# Patient Record
Sex: Female | Born: 1955 | Race: White | Hispanic: No | Smoking: Former smoker
Health system: Southern US, Community
[De-identification: ages and names within clinical notes are randomized; demographics above are authoritative.]

## PROBLEM LIST (undated history)

## (undated) DIAGNOSIS — E559 Vitamin D deficiency, unspecified: Secondary | ICD-10-CM

## (undated) DIAGNOSIS — K219 Gastro-esophageal reflux disease without esophagitis: Secondary | ICD-10-CM

## (undated) DIAGNOSIS — F419 Anxiety disorder, unspecified: Secondary | ICD-10-CM

## (undated) HISTORY — DX: Anxiety disorder, unspecified: F41.9

## (undated) HISTORY — PX: CHOLECYSTECTOMY: SHX55

## (undated) HISTORY — DX: Vitamin D deficiency, unspecified: E55.9

## (undated) HISTORY — DX: Gastro-esophageal reflux disease without esophagitis: K21.9

## (undated) HISTORY — PX: ANAL SPHINCTEROPLASTY: SUR1305

---
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
## 1981-05-13 HISTORY — PX: ABDOMINAL HYSTERECTOMY: SHX81

## 2006-09-17 ENCOUNTER — Ambulatory Visit: Payer: Self-pay

## 2007-07-07 ENCOUNTER — Ambulatory Visit: Payer: Self-pay | Admitting: Gastroenterology

## 2008-04-06 ENCOUNTER — Ambulatory Visit: Payer: Self-pay | Admitting: Family Medicine

## 2009-05-19 ENCOUNTER — Ambulatory Visit: Payer: Self-pay | Admitting: Family Medicine

## 2009-09-07 ENCOUNTER — Ambulatory Visit: Payer: Self-pay

## 2009-09-11 ENCOUNTER — Ambulatory Visit: Payer: Self-pay | Admitting: Podiatry

## 2009-09-15 ENCOUNTER — Ambulatory Visit: Payer: Self-pay | Admitting: Podiatry

## 2010-07-20 ENCOUNTER — Ambulatory Visit: Payer: Self-pay | Admitting: Family Medicine

## 2011-06-20 ENCOUNTER — Emergency Department: Payer: Self-pay | Admitting: Emergency Medicine

## 2011-06-20 LAB — CBC
HCT: 42.6 % (ref 35.0–47.0)
HGB: 14.6 g/dL (ref 12.0–16.0)
Platelet: 222 10*3/uL (ref 150–440)
RBC: 4.79 10*6/uL (ref 3.80–5.20)
WBC: 6.7 10*3/uL (ref 3.6–11.0)

## 2011-06-20 LAB — BASIC METABOLIC PANEL
Anion Gap: 10 (ref 7–16)
BUN: 11 mg/dL (ref 7–18)
Calcium, Total: 8.9 mg/dL (ref 8.5–10.1)
Creatinine: 0.73 mg/dL (ref 0.60–1.30)
EGFR (African American): >60
EGFR (Non-African Amer.): >60
Glucose: 117 mg/dL — ABNORMAL HIGH (ref 65–99)
Osmolality: 284 (ref 275–301)
Sodium: 142 mmol/L (ref 136–145)

## 2011-09-11 ENCOUNTER — Ambulatory Visit: Payer: Self-pay

## 2012-11-06 DIAGNOSIS — K589 Irritable bowel syndrome without diarrhea: Secondary | ICD-10-CM | POA: Insufficient documentation

## 2012-11-06 DIAGNOSIS — R159 Full incontinence of feces: Secondary | ICD-10-CM | POA: Insufficient documentation

## 2012-11-06 DIAGNOSIS — F17210 Nicotine dependence, cigarettes, uncomplicated: Secondary | ICD-10-CM | POA: Insufficient documentation

## 2012-11-06 DIAGNOSIS — IMO0002 Reserved for concepts with insufficient information to code with codable children: Secondary | ICD-10-CM | POA: Insufficient documentation

## 2012-11-12 DIAGNOSIS — N3281 Overactive bladder: Secondary | ICD-10-CM | POA: Insufficient documentation

## 2012-12-15 ENCOUNTER — Emergency Department: Payer: Self-pay | Admitting: Emergency Medicine

## 2014-04-10 ENCOUNTER — Emergency Department: Payer: Self-pay | Admitting: Internal Medicine

## 2015-01-09 ENCOUNTER — Encounter: Payer: Self-pay | Admitting: Family Medicine

## 2015-01-09 ENCOUNTER — Ambulatory Visit (INDEPENDENT_AMBULATORY_CARE_PROVIDER_SITE_OTHER): Payer: Self-pay | Admitting: Family Medicine

## 2015-01-09 ENCOUNTER — Other Ambulatory Visit: Payer: Self-pay | Admitting: Family Medicine

## 2015-01-09 ENCOUNTER — Telehealth: Payer: Self-pay | Admitting: Family Medicine

## 2015-01-09 VITALS — BP 126/80 | HR 74 | Temp 98.2°F | Resp 16 | Ht 67.5 in | Wt 169.8 lb

## 2015-01-09 DIAGNOSIS — F329 Major depressive disorder, single episode, unspecified: Secondary | ICD-10-CM

## 2015-01-09 DIAGNOSIS — F32A Depression, unspecified: Secondary | ICD-10-CM

## 2015-01-09 DIAGNOSIS — E559 Vitamin D deficiency, unspecified: Secondary | ICD-10-CM | POA: Insufficient documentation

## 2015-01-09 DIAGNOSIS — F419 Anxiety disorder, unspecified: Secondary | ICD-10-CM

## 2015-01-09 DIAGNOSIS — K219 Gastro-esophageal reflux disease without esophagitis: Secondary | ICD-10-CM | POA: Insufficient documentation

## 2015-01-09 MED ORDER — ALPRAZOLAM 1 MG PO TABS: 1.0000 mg | ORAL_TABLET | Freq: Three times a day (TID) | ORAL | Status: DC | PRN

## 2015-01-09 MED ORDER — CITALOPRAM HYDROBROMIDE 20 MG PO TABS: 20.0000 mg | ORAL_TABLET | Freq: Every day | ORAL | Status: DC

## 2015-01-09 MED ORDER — ESCITALOPRAM OXALATE 10 MG PO TABS: ORAL_TABLET | ORAL | Status: DC

## 2015-01-09 NOTE — Telephone Encounter (Signed)
Change of prescription due to cost, please advise.

## 2015-01-09 NOTE — Progress Notes (Signed)
Subjective:     Patient ID: Evelyn Wiley, female   DOB: [birth year], 59 y.o.   MRN: [id number]  HPI  Chief Complaint  Patient presents with  . Depression    Patient is present in office today to discuss feeling of depression, patient states that she has been dealing with this for many years and was improving but in the past 2 months symptoms had returned. Patient states that it had began when she started having problems with her boyfriend, patient reports trying to get out of relationship and move closer to her daughter. Patient reports that symptoms of depression have been affecting her home life and work, patient states that she hates her job.  Denies suicidal ideation. Has been on sertraline in the past and did not find it helpful. She has used her last Xanax recently to help sleep. 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 Wishes time off for medication to start working and to prepare to move to the [place] area.   Review of Systems  Constitutional:       Has lost a significant amount of weight with dietary changes and appetite suppressants for a few months [facility].  Psychiatric/Behavioral:       Describes decrease concentration and inability to get to sleep: "My brain ain't shutting off."       Objective:   Physical Exam  Constitutional: She appears well-developed and well-nourished. No distress.  Neck: No thyromegaly present.  Cardiovascular: Normal rate and regular rhythm.   Pulmonary/Chest: Breath sounds normal.  Psychiatric: Her behavior is normal. Thought content normal. Her mood appears anxious.       Assessment:    1. Anxiety - ALPRAZolam (XANAX) 1 MG tablet; Take 1 tablet (1 mg total) by mouth 3 (three) times daily as needed for anxiety.  Dispense: 30 tablet; Refill: 0  2. Depression - escitalopram (LEXAPRO) 10 MG tablet; Start at 1/2 pill for the first 5 days then a whole pill daily  Dispense: 30 tablet;  Refill: 0    Plan:   Phone f/u in two weeks. Work excuse for 8/29-9/6.

## 2015-01-09 NOTE — Patient Instructions (Signed)
Phone f/u in 2 weeks 

## 2015-01-09 NOTE — Telephone Encounter (Signed)
Pt stated that she can not afford to pay 113$ forescitalopram (LEXAPRO) 10 MG tablet. Pt wanted to know if Nadine Counts could call in something cheaper to [employer]. Pt stated she doesn't have insurance and needs something cheaper. Pt would like to know if we have samples of this medication or samples of something else that might helper her. Please advise. Thanks TNP

## 2015-01-09 NOTE — Telephone Encounter (Signed)
Have sent in citalopram.

## 2015-01-10 NOTE — Telephone Encounter (Signed)
Patient has been advised of Rx change. KW

## 2015-02-07 ENCOUNTER — Telehealth: Payer: Self-pay | Admitting: Family Medicine

## 2015-02-07 NOTE — Telephone Encounter (Signed)
Pt called to say she ws doing well with the medication but she will need a refill soon.citalopram (CELEXA) 20 MG tablet   She uses [facility] [place]  Her call back is [phone]  Thanks Barth Kirks

## 2015-02-08 ENCOUNTER — Other Ambulatory Visit: Payer: Self-pay | Admitting: Family Medicine

## 2015-02-08 DIAGNOSIS — F32A Depression, unspecified: Secondary | ICD-10-CM

## 2015-02-08 DIAGNOSIS — F329 Major depressive disorder, single episode, unspecified: Secondary | ICD-10-CM

## 2015-02-08 MED ORDER — CITALOPRAM HYDROBROMIDE 20 MG PO TABS: 20.0000 mg | ORAL_TABLET | Freq: Every day | ORAL | Status: DC

## 2015-02-08 NOTE — Telephone Encounter (Signed)
Celexa refilled.

## 2015-02-08 NOTE — Telephone Encounter (Signed)
Refill request

## 2015-02-09 NOTE — Telephone Encounter (Signed)
Left message for patient to notify her that Rx was sent to pharmacy. KW

## 2015-03-02 ENCOUNTER — Emergency Department: Payer: Self-pay

## 2015-03-02 ENCOUNTER — Encounter: Payer: Self-pay | Admitting: Emergency Medicine

## 2015-03-02 ENCOUNTER — Emergency Department
Admission: EM | Admit: 2015-03-02 | Discharge: 2015-03-02 | Disposition: A | Discharge status: Home / Self Care | Payer: Self-pay | Attending: Emergency Medicine | Admitting: Emergency Medicine

## 2015-03-02 DIAGNOSIS — M25461 Effusion, right knee: Secondary | ICD-10-CM | POA: Insufficient documentation

## 2015-03-02 DIAGNOSIS — Y9301 Activity, walking, marching and hiking: Secondary | ICD-10-CM | POA: Insufficient documentation

## 2015-03-02 DIAGNOSIS — Z79899 Other long term (current) drug therapy: Secondary | ICD-10-CM | POA: Insufficient documentation

## 2015-03-02 DIAGNOSIS — S8001XA Contusion of right knee, initial encounter: Secondary | ICD-10-CM | POA: Insufficient documentation

## 2015-03-02 DIAGNOSIS — Y998 Other external cause status: Secondary | ICD-10-CM | POA: Insufficient documentation

## 2015-03-02 DIAGNOSIS — Y9289 Other specified places as the place of occurrence of the external cause: Secondary | ICD-10-CM | POA: Insufficient documentation

## 2015-03-02 DIAGNOSIS — S8391XA Sprain of unspecified site of right knee, initial encounter: Secondary | ICD-10-CM | POA: Insufficient documentation

## 2015-03-02 DIAGNOSIS — W010XXA Fall on same level from slipping, tripping and stumbling without subsequent striking against object, initial encounter: Secondary | ICD-10-CM | POA: Insufficient documentation

## 2015-03-02 DIAGNOSIS — Z720 Tobacco use: Secondary | ICD-10-CM | POA: Insufficient documentation

## 2015-03-02 NOTE — ED Provider Notes (Signed)
[facility] Emergency Department Provider Note ____________________________________________  Time seen: 1255  I have reviewed the triage vital signs and the nursing notes.  HISTORY  Chief Complaint  Knee Pain  HPI Evelyn Wiley is a 59 y.o. female with right knee pain after initial injury about 6 days prior. She describes she was walking in a basement when she tripped over an electrical cord, causing her to fall on all-fours.Of note is she has a left wrist fracture which is currently in a fiberglass cast. She notes that she was relatively well until about 2 days ago when her right knee pain began to worsen. She noted increased tightness and swelling. She has noted a pop to her right knee yesterday. She said today for evaluation of her right knee pain. She does note some concern because of a remote right knee patella fracture some years ago. She rates her pain at a 7/10 in triage.  Past Medical History  Diagnosis Date  . GERD (gastroesophageal reflux disease)   . Anxiety   . Vitamin D deficiency     Patient Active Problem List   Diagnosis Date Noted  . Anxiety 01/09/2015  . Esophageal reflux 01/09/2015  . Avitaminosis D 01/09/2015    Past Surgical History  Procedure Laterality Date  . Abdominal hysterectomy  1983    partial  . Tonsillectomy and adenoidectomy  1970  . Cholecystectomy      Current Outpatient Rx  Name  Route  Sig  Dispense  Refill  . ALPRAZolam (XANAX) 1 MG tablet   Oral   Take 1 tablet (1 mg total) by mouth 3 (three) times daily as needed for anxiety.   30 tablet   0   . citalopram (CELEXA) 20 MG tablet   Oral   Take 1 tablet (20 mg total) by mouth daily. Start at 1/2 pill for the first 5 days   30 tablet   5     Allergies Review of patient's allergies indicates no known allergies.  Family History  Problem Relation Age of Onset  . Cancer Mother     ovarian  . Heart attack Father   . Cancer Brother     pancreatic     Social History Social History  Substance Use Topics  . Smoking status: Current Every Day Smoker    Types: Cigarettes  . Smokeless tobacco: None  . Alcohol Use: No   Review of Systems  Constitutional: Negative for fever. Eyes: Negative for visual changes. ENT: Negative for sore throat. Cardiovascular: Negative for chest pain. Respiratory: Negative for shortness of breath. Gastrointestinal: Negative for abdominal pain, vomiting and diarrhea. Genitourinary: Negative for dysuria. Musculoskeletal: Negative for back pain. Right knee pain as above Skin: Negative for rash. Neurological: Negative for headaches, focal weakness or numbness. ____________________________________________  PHYSICAL EXAM:  VITAL SIGNS: ED Triage Vitals  Enc Vitals Group     BP 03/02/15 1159 136/84 mmHg     Pulse Rate 03/02/15 1159 81     Resp 03/02/15 1159 18     Temp 03/02/15 1159 98.2 F (36.8 C)     Temp Source 03/02/15 1159 Oral     SpO2 03/02/15 1159 98 %     Weight 03/02/15 1141 164 lb (74.39 kg)     Height 03/02/15 1141  (1.727 m)     Head Cir --      Peak Flow --      Pain Score 03/02/15 1141 7     Pain Loc --  Pain Edu? --      Excl. in GC? --    Constitutional: Alert and oriented. Well appearing and in no distress. Head: Normocephalic and atraumatic.      Eyes: Conjunctivae are normal. PERRL. Normal extraocular movements      Ears: Canals clear. TMs intact bilaterally.   Nose: No congestion/rhinorrhea.   Mouth/Throat: Mucous membranes are moist.   Neck: Supple. No thyromegaly. Hematological/Lymphatic/Immunological: No cervical lymphadenopathy. Cardiovascular: Normal rate, regular rhythm.  Respiratory: Normal respiratory effort. No wheezes/rales/rhonchi. Gastrointestinal: Soft and nontender. No distention. Musculoskeletal: Right knee without obvious deformity, but mild effusion is noted. Normal patellar tracking on exam. No valgus of the wrist joint stress.  Patient has been within the palpation over the superior lateral patellar region. No popliteal space fullness or tenderness. No calf or Achilles tenderness appreciated. Nontender with normal range of motion in all extremities.  Neurologic:  Normal gait without ataxia. Normal speech and language. No gross focal neurologic deficits are appreciated. Skin:  Skin is warm, dry and intact. No rash noted. Psychiatric: Mood and affect are normal. Patient exhibits appropriate insight and judgment. ____________________________________________   RADIOLOGY Right Knee IMPRESSION: 1. Small joint effusion. Otherwise, no acute findings. 2. Mild degenerative change of the knee.  I, Helvi Royals, Charlesetta IvoryJenise V Bacon, personally viewed and evaluated these images (plain radiographs) as part of my medical decision making.  ____________________________________________  PROCEDURES  Ace wrap ____________________________________________  INITIAL IMPRESSION / ASSESSMENT AND PLAN / ED COURSE  Right knee contusion with effusion. No radiologic evidence of fracture. Patient discharged with ace wrap and sprain instructions. Follow-up with ortho provider for further care.  ____________________________________________  FINAL CLINICAL IMPRESSION(S) / ED DIAGNOSES  Final diagnoses:  Knee contusion, right, initial encounter  Knee effusion, right  Knee sprain, right, initial encounter      Lissa HoardJenise V Bacon Makhiya Coburn, PA-C 03/02/15 1507  Sharman CheekPhillip Stafford, MD 03/02/15 1539

## 2015-03-02 NOTE — ED Notes (Signed)
Pt to ed with c/o right knee pain after fall on Friday night.  Pt with swelling that started today.

## 2015-03-02 NOTE — Discharge Instructions (Signed)
Contusion A contusion is a deep bruise. Contusions happen when an injury causes bleeding under the skin. Symptoms of bruising include pain, swelling, and discolored skin. The skin may turn blue, purple, or yellow. HOME CARE   Rest the injured area.  If told, put ice on the injured area.  Put ice in a plastic bag.  Place a towel between your skin and the bag.  Leave the ice on for 20 minutes, 2-3 times per day.  If told, put light pressure (compression) on the injured area using an elastic bandage. Make sure the bandage is not too tight. Remove it and put it back on as told by your doctor.  If possible, raise (elevate) the injured area above the level of your heart while you are sitting or lying down.  Take over-the-counter and prescription medicines only as told by your doctor. GET HELP IF:  Your symptoms do not get better after several days of treatment.  Your symptoms get worse.  You have trouble moving the injured area. GET HELP RIGHT AWAY IF:   You have very bad pain.  You have a loss of feeling (numbness) in a hand or foot.  Your hand or foot turns pale or cold.   This information is not intended to replace advice given to you by your health care provider. Make sure you discuss any questions you have with your health care provider.   Document Released: 10/16/2007 Document Revised: 01/18/2015 Document Reviewed: 09/14/2014 Elsevier Interactive Patient Education 2016 Elsevier Inc.  Knee Effusion Knee effusion means that you have extra fluid in your knee. This can cause pain. Your knee may be more difficult to bend and move. HOME CARE  Use crutches as told by your doctor.  Wear a knee brace as told by your doctor.  Apply ice to the swollen area:  Put ice in a plastic bag.  Place a towel between your skin and the bag.  Leave the ice on for 20 minutes, 2-3 times per day.  Keep your knee raised (elevated) when you are sitting or lying down.  Take medicines only  as told by your doctor.  Do any rehabilitation or strengthening exercises as told by your doctor.  Rest your knee as told by your doctor. You may start doing your normal activities again when your doctor says it is okay.  Keep all follow-up visits as told by your doctor. This is important. GET HELP IF:   You continue to have pain in your knee. GET HELP RIGHT AWAY IF:  You have increased swelling or redness of your knee.  You have severe pain in your knee.  You have a fever.   This information is not intended to replace advice given to you by your health care provider. Make sure you discuss any questions you have with your health care provider.   Document Released: 06/01/2010 Document Revised: 05/20/2014 Document Reviewed: 12/13/2013 Elsevier Interactive Patient Education 2016 Elsevier Inc.  Knee Sprain A knee sprain is a tear in the strong bands of tissue that connect the bones (ligaments) of your knee. HOME CARE  Raise (elevate) your injured knee to lessen puffiness (swelling).  To ease pain and puffiness, put ice on the injured area.  Put ice in a plastic bag.  Place a towel between your skin and the bag.  Leave the ice on for 20 minutes, 2-3 times a day.  Only take medicine as told by your doctor.  Do not leave your knee unprotected until pain and  stiffness go away (usually 4-6 weeks).  If you have a cast or splint, do not get it wet. If your doctor told you to not take it off, cover it with a plastic bag when you shower or bathe. Do not swim.  Your doctor may have you do exercises to prevent or limit permanent weakness and stiffness. GET HELP RIGHT AWAY IF:   Your cast or splint becomes damaged.  Your pain gets worse.  You have a lot of pain, puffiness, or numbness below the cast or splint. MAKE SURE YOU:   Understand these instructions.  Will watch your condition.  Will get help right away if you are not doing well or get worse.   This information is  not intended to replace advice given to you by your health care provider. Make sure you discuss any questions you have with your health care provider.   Document Released: 04/17/2009 Document Revised: 05/04/2013 Document Reviewed: 01/05/2013 Elsevier Interactive Patient Education [employer]2016 Elsevier Inc.  Your exam and x-rays appear normal after your accident.  Wear the ace wrap as needed for support. Rest with the leg elevated and apply ice to reduce swelling. Follow-up with your ortho provider as needed.

## 2015-07-20 ENCOUNTER — Other Ambulatory Visit: Payer: Self-pay | Admitting: Family Medicine

## 2015-07-20 ENCOUNTER — Telehealth: Payer: Self-pay | Admitting: Family Medicine

## 2015-07-20 DIAGNOSIS — Z8619 Personal history of other infectious and parasitic diseases: Secondary | ICD-10-CM

## 2015-07-20 DIAGNOSIS — F419 Anxiety disorder, unspecified: Secondary | ICD-10-CM

## 2015-07-20 MED ORDER — ALPRAZOLAM 1 MG PO TABS: 1.0000 mg | ORAL_TABLET | Freq: Three times a day (TID) | ORAL | Status: DC | PRN

## 2015-07-20 MED ORDER — ACYCLOVIR 5 % EX CREA
TOPICAL_CREAM | CUTANEOUS | Status: DC
Start: 2015-07-20 — End: 2015-10-27

## 2015-07-20 NOTE — Telephone Encounter (Signed)
Prescription has been called into pharmacy. KW 

## 2015-07-20 NOTE — Telephone Encounter (Signed)
Pt stated that she used to take Zorvax years ago for fever blisters and she has started having them again. Pt would like an RX for Zorvax pill or cream or something similar to [facility] [place] and a refill for ALPRAZolam (XANAX) 1 MG tablet. I advised pt needed an OV pt stated that she doesn't have the money to come in and she doesn't have insurance. Pt requested that we please send in RX without an OV. Please advise. Thanks TNP

## 2015-07-20 NOTE — Telephone Encounter (Signed)
Please review chart and advise. KW 

## 2015-07-20 NOTE — Telephone Encounter (Signed)
Please call in Xanax as updated in the EMR

## 2015-10-27 ENCOUNTER — Encounter: Payer: Self-pay | Admitting: Family Medicine

## 2015-10-27 ENCOUNTER — Ambulatory Visit (INDEPENDENT_AMBULATORY_CARE_PROVIDER_SITE_OTHER): Payer: Self-pay | Admitting: Family Medicine

## 2015-10-27 VITALS — BP 112/70 | HR 72 | Temp 97.6°F | Resp 16 | Wt 205.0 lb

## 2015-10-27 DIAGNOSIS — L659 Nonscarring hair loss, unspecified: Secondary | ICD-10-CM

## 2015-10-27 DIAGNOSIS — R635 Abnormal weight gain: Secondary | ICD-10-CM

## 2015-10-27 DIAGNOSIS — N951 Menopausal and female climacteric states: Secondary | ICD-10-CM

## 2015-10-27 NOTE — Patient Instructions (Addendum)
We will call you with the lab results. Encourage walking 30 minutes daily. Consider getting mammogram and we can try estrogen for your menopausal symptoms.

## 2015-10-27 NOTE — Progress Notes (Signed)
Subjective:     Patient ID: Evelyn DurandMargaret J Wiley, female   DOB: [birth year], 60 y.o.   MRN: [id number]  HPI  Chief Complaint  Patient presents with  . Weight Gain    pt has also noticed hair loss  . Hot Flashes    for about the last 2 months. Pt had a partial hysterectomy at age 60  . Insomnia  Also reports diminished sex drive. States she tends to binge on the weekend eating low cal ice cream or going out to eat frequently. 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   Review of Systems     Objective:   Physical Exam  Constitutional: She appears well-developed and well-nourished. No distress.  Neck: No thyroid mass and no thyromegaly present.  Cardiovascular: Normal rate and regular rhythm.   Pulmonary/Chest: Breath sounds normal.  Musculoskeletal: She exhibits no edema (of lower extremities).  Skin:  No scalp scarring or areas of hair loss noted.       Assessment:    1. Sweats, menopausal  2. Weight gain - T4, free - TSH  3. Hair thinning - T4, free - TSH    Plan:    Encouraged walking program. Update mammogram. Consider hormone replacement therapy.

## 2015-10-28 LAB — TSH: TSH: 2.93 u[IU]/mL (ref 0.450–4.500)

## 2015-10-28 LAB — T4, FREE: Free T4: 1.07 ng/dL (ref 0.82–1.77)

## 2015-10-30 ENCOUNTER — Telehealth: Payer: Self-pay

## 2015-10-30 NOTE — Telephone Encounter (Signed)
-----   Message from Anola Gurneyobert Chauvin, [place] sent at 10/30/2015  7:47 AM EDT ----- Thyroid ok. [place] Let me know if they need an order from me..Marland Kitchen

## 2015-10-30 NOTE — Telephone Encounter (Signed)
Patient advised as directed below.  Thanks,  -Terrence Pizana 

## 2015-10-30 NOTE — Telephone Encounter (Signed)
LMTCB  Thanks,  -Joseline 

## 2016-01-05 ENCOUNTER — Telehealth: Payer: Self-pay | Admitting: Family Medicine

## 2016-01-05 NOTE — Telephone Encounter (Signed)
Referral request, okay to approve? KW

## 2016-01-05 NOTE — Telephone Encounter (Signed)
Pt calling requesting a referral for her mammogram, pt states she would like that done at [facility], pt also states she was told that our office needed to call [facility] @ [phone] and they can tell our office what kind of mammogram pt needs to have done. Pt gave me the Fax # [phone] for the order to be sent to. Pt states if you have any question please feel free to call her @ [phone] and if she don't answer to please leave message. Thanks CC

## 2016-01-05 NOTE — Telephone Encounter (Signed)
Order for screening mammogram faxed.

## 2016-01-09 ENCOUNTER — Other Ambulatory Visit: Payer: Self-pay | Admitting: Family Medicine

## 2016-01-10 ENCOUNTER — Encounter: Payer: Self-pay | Admitting: Family Medicine

## 2016-01-10 ENCOUNTER — Ambulatory Visit (INDEPENDENT_AMBULATORY_CARE_PROVIDER_SITE_OTHER): Payer: BLUE CROSS/BLUE SHIELD | Admitting: Family Medicine

## 2016-01-10 VITALS — BP 144/84 | HR 68 | Temp 98.4°F | Resp 16 | Ht 68.0 in | Wt 223.4 lb

## 2016-01-10 DIAGNOSIS — Z1273 Encounter for screening for malignant neoplasm of ovary: Secondary | ICD-10-CM

## 2016-01-10 DIAGNOSIS — Z0000 Encounter for general adult medical examination without abnormal findings: Secondary | ICD-10-CM | POA: Diagnosis not present

## 2016-01-10 NOTE — Patient Instructions (Addendum)
Please check on the date of your last colonoscopy. We will call you with the lab work and results of your mammogram and ultrasound. Encourage walking 30 minutes daily.

## 2016-01-10 NOTE — Progress Notes (Signed)
Subjective:     Patient ID: Evelyn DurandMargaret J Wiley, female   DOB: Jul 18, 1955, 60 y.o.   MRN: [id number]  HPI  Chief Complaint  Patient presents with  . Annual Exam    Patient comes in office today for her annual physical, she states that he has no questions or concerns today. Patient is due today for a flu vaccine but has declined. Patients last reported: Tdap 04/10/2014, patient has a mammogram scheduled today and states that she has not a pap smear in  years due to partial hysterectomy.   States she is trying [place] to travel difficulties and will fill out info.release form. Currently working at a call center. She does not exercise regularly except when she is with her grandchildren.   Review of Systems General: Feeling well HEENT: Has dental, eye, and hearing appointments lined up in the next few days. Cardiovascular: no chest pain, shortness of breath, or palpitations GI: occasional heartburn she treats with TUMS. Reports hx of IBS-D. Milk is a trigger for her. GU:  no change in bladder habits, Wishes to know if her ovaries are ok due to hx of bloating. Reports normal colon[facility] at 53 [facility] her recollection [facility]. Endocrine: reports decreased libido and sweats. Wishes to try estrogen replacement therapy if mammogram is ok. States she overeats at times then feels guilty but no purging. Psychiatric: not depressed, rarely uses Xanax at night if ruminating about things. Musculoskeletal: Reports occasional back spasms but not requiring medication for this.    Objective:   Physical Exam  Constitutional: She appears well-developed and well-nourished. No distress.  Psychiatric:  Mildly anxious  Eyes: PERRLA, EOMI Neck: no thyromegaly, tenderness or nodules,  ENT: TM's intact without inflammation; No tonsillar enlargement or exudate, Lungs: Clear Breasts: no mass or nipple discharge; no axillary adenopathy Heart : RRR without murmur or  gallop Abd: bowel sounds present, soft, non-tender, no organomegaly Extremities: no edema     Assessment:    1. Annual physical exam - Comprehensive metabolic panel - Lipid panel  2. Screening for ovarian cancer: Have [facility] out pelvic ultrasound requ[facility] near where she lives.    Plan:    Discussed regular exercise, confirm date of last colonoscopy. Further f/u pending results of tests and labs.

## 2016-01-11 ENCOUNTER — Telehealth: Payer: Self-pay

## 2016-01-11 LAB — LIPID PANEL
Chol/HDL Ratio: 5.7 ratio units — ABNORMAL HIGH (ref 0.0–4.4)
Cholesterol, Total: 205 mg/dL — ABNORMAL HIGH (ref 100–199)
HDL: 36 mg/dL — ABNORMAL LOW (ref >39)
LDL Calculated: 103 mg/dL — ABNORMAL HIGH (ref 0–99)
TRIGLYCERIDES: 332 mg/dL — AB (ref 0–149)
VLDL Cholesterol Cal: 66 mg/dL — ABNORMAL HIGH (ref 5–40)

## 2016-01-11 LAB — COMPREHENSIVE METABOLIC PANEL
ALT: 25 IU/L (ref 0–32)
AST: 17 IU/L (ref 0–40)
Albumin/Globulin Ratio: 1.4 (ref 1.2–2.2)
Albumin: 4.2 g/dL (ref 3.5–5.5)
Alkaline Phosphatase: 70 IU/L (ref 39–117)
BUN/Creatinine Ratio: 15 (ref 9–23)
BUN: 11 mg/dL (ref 6–24)
Bilirubin Total: 0.4 mg/dL (ref 0.0–1.2)
CALCIUM: 9 mg/dL (ref 8.7–10.2)
CO2: 22 mmol/L (ref 18–29)
Chloride: 101 mmol/L (ref 96–106)
Creatinine, Ser: 0.74 mg/dL (ref 0.57–1.00)
GFR, EST AFRICAN AMERICAN: 103 mL/min/{1.73_m2} (ref >59)
GFR, EST NON AFRICAN AMERICAN: 90 mL/min/{1.73_m2} (ref >59)
Globulin, Total: 2.9 g/dL (ref 1.5–4.5)
Glucose: 93 mg/dL (ref 65–99)
Potassium: 4.5 mmol/L (ref 3.5–5.2)
Sodium: 140 mmol/L (ref 134–144)
TOTAL PROTEIN: 7.1 g/dL (ref 6.0–8.5)

## 2016-01-11 NOTE — Telephone Encounter (Signed)
Patient has been advised. KW 

## 2016-01-11 NOTE — Telephone Encounter (Signed)
-----   Message from Anola Gurneyobert Chauvin, [place] sent at 01/11/2016  7:56 AM EDT ----- Your labs look good with only mildly elevated cholesterol. Your calculated 10 year risk for developing cardiovascular disease is low at 4.8%.Would encourage exercise as we discussed at your office visit.

## 2016-01-17 ENCOUNTER — Telehealth: Payer: Self-pay | Admitting: Family Medicine

## 2016-01-17 ENCOUNTER — Encounter: Payer: Self-pay | Admitting: Family Medicine

## 2016-01-17 NOTE — Telephone Encounter (Signed)
[facility] and she would like a call back to get the results. Pt stated ok to leave the results on her voicemail. Please advise. Thanks TNP

## 2016-01-18 NOTE — Telephone Encounter (Signed)
Patient advised. Patient wanted to know if Evelyn Wiley is still going to send a Rx to the pharmacy. Patient didn't know the name of the medication that you'll discussed.

## 2016-01-18 NOTE — Telephone Encounter (Signed)
LMTCB

## 2016-01-18 NOTE — Telephone Encounter (Signed)
Normal mammogram

## 2016-01-18 NOTE — Telephone Encounter (Signed)
Let's get the ultrasound of your ovaries done first. Is [facility] going to schedule it?

## 2016-01-18 NOTE — Telephone Encounter (Signed)
  LMTCB 01/18/2016  Thanks,   -Laura  

## 2016-01-22 NOTE — Telephone Encounter (Signed)
LMTCB-KW 

## 2016-01-23 NOTE — Telephone Encounter (Signed)
Patient states that on 01/17/16 [facility] informed her that they would be scheduling ultrasound and that they would be contacting her with date. Patient states that she still hasnt heard back, I instructed patient to contact them to get update on when she will be having this done. KW

## 2016-01-26 ENCOUNTER — Other Ambulatory Visit: Payer: Self-pay | Admitting: Family Medicine

## 2016-01-26 DIAGNOSIS — Z1273 Encounter for screening for malignant neoplasm of ovary: Secondary | ICD-10-CM

## 2016-01-29 ENCOUNTER — Other Ambulatory Visit: Payer: Self-pay | Admitting: Family Medicine

## 2016-01-29 DIAGNOSIS — Z780 Asymptomatic menopausal state: Secondary | ICD-10-CM

## 2016-01-29 MED ORDER — ESTRADIOL 0.5 MG PO TABS: 0.5000 mg | ORAL_TABLET | Freq: Every day | ORAL | 2 refills | Status: DC

## 2016-01-29 NOTE — Telephone Encounter (Signed)
I have sent in estrogen pills. It may take 12 weeks for full effect. I have asked my scheduler, Maralyn SagoSarah, to work on the ultrasound for your ovaries.

## 2016-01-29 NOTE — Telephone Encounter (Signed)
Please advise. KW 

## 2016-01-29 NOTE — Telephone Encounter (Signed)
LMTCB-KW 

## 2016-01-29 NOTE — Telephone Encounter (Signed)
Pt is requesting a low dose hormone pill be sent to [facility] in [place] for hot flashes

## 2016-01-29 NOTE — Telephone Encounter (Signed)
Patient has been advised. KW 

## 2016-03-18 ENCOUNTER — Ambulatory Visit (INDEPENDENT_AMBULATORY_CARE_PROVIDER_SITE_OTHER): Payer: BLUE CROSS/BLUE SHIELD | Admitting: Family Medicine

## 2016-03-18 ENCOUNTER — Encounter: Payer: Self-pay | Admitting: Family Medicine

## 2016-03-18 VITALS — BP 138/94 | HR 78 | Temp 98.0°F | Resp 16 | Wt 228.0 lb

## 2016-03-18 DIAGNOSIS — R600 Localized edema: Secondary | ICD-10-CM

## 2016-03-18 DIAGNOSIS — R030 Elevated blood-pressure reading, without diagnosis of hypertension: Secondary | ICD-10-CM | POA: Diagnosis not present

## 2016-03-18 MED ORDER — HYDROCHLOROTHIAZIDE 25 MG PO TABS: 25.0000 mg | ORAL_TABLET | Freq: Every day | ORAL | 0 refills | Status: DC

## 2016-03-18 NOTE — Progress Notes (Signed)
Subjective:     Patient ID: Evelyn DurandMargaret J Wiley, female   DOB: 10/03/1955, 60 y.o.   MRN: [id number]  HPI  Chief Complaint  Patient presents with  . Foot Swelling    Bilateral lower extremity swelling x 1 month or more. Exacerbated by desk job. Notices tightness, discomfort and tingling. Also notes some SOB; denies chest pain, orthopnea. Is relieved by elevation. Is concerned because she has noticed lightheadedness. Pt went to dentist in mid October and her BP was elevated at that time. She believes her SBP was about 97. There is a family H/O of hypertension. Pt believes her sx can be secondary to HTN. BP today is 138/94.  Labs were ok at physical in August. States she stays active on the weekends but does not exercise regularly during the week.   Review of Systems     Objective:   Physical Exam  Constitutional: She appears well-developed and well-nourished. No distress.  Cardiovascular: Normal rate and regular rhythm.   Pulmonary/Chest: Breath sounds normal.  Musculoskeletal: She exhibits edema (bilateral 1+ pedal edema).       Assessment:    1. Pedal edema - Renal function panel - hydrochlorothiazide (HYDRODIURIL) 25 MG tablet; Take 1 tablet (25 mg total) by mouth daily.  Dispense: 30 tablet; Refill: 0  2. Blood pressure elevated without history of HTN    Plan:    Will reassess in 3 weeks.

## 2016-03-18 NOTE — Patient Instructions (Signed)
Let me know if you don't like the medicine before the appointment.

## 2016-03-19 ENCOUNTER — Other Ambulatory Visit: Payer: Self-pay | Admitting: Family Medicine

## 2016-03-19 DIAGNOSIS — Z780 Asymptomatic menopausal state: Secondary | ICD-10-CM

## 2016-03-19 MED ORDER — ESTRADIOL 0.5 MG PO TABS: 0.5000 mg | ORAL_TABLET | Freq: Every day | ORAL | 0 refills | Status: DC

## 2016-04-06 IMAGING — CR DG KNEE COMPLETE 4+V*R*
1 series · 4 of 4 positions shown · non-contrast
Comparison: Right knee radiographs - 04/10/2014

CLINICAL DATA: Right knee pain after fall on [REDACTED] evening. Remote
history of patellar fracture.

EXAM:
RIGHT KNEE - COMPLETE 4+ VIEW

[Series 1: dg knee complete 4 views right · 0.14mm/px · 4 of 4 slices shown]
[im 1/4]
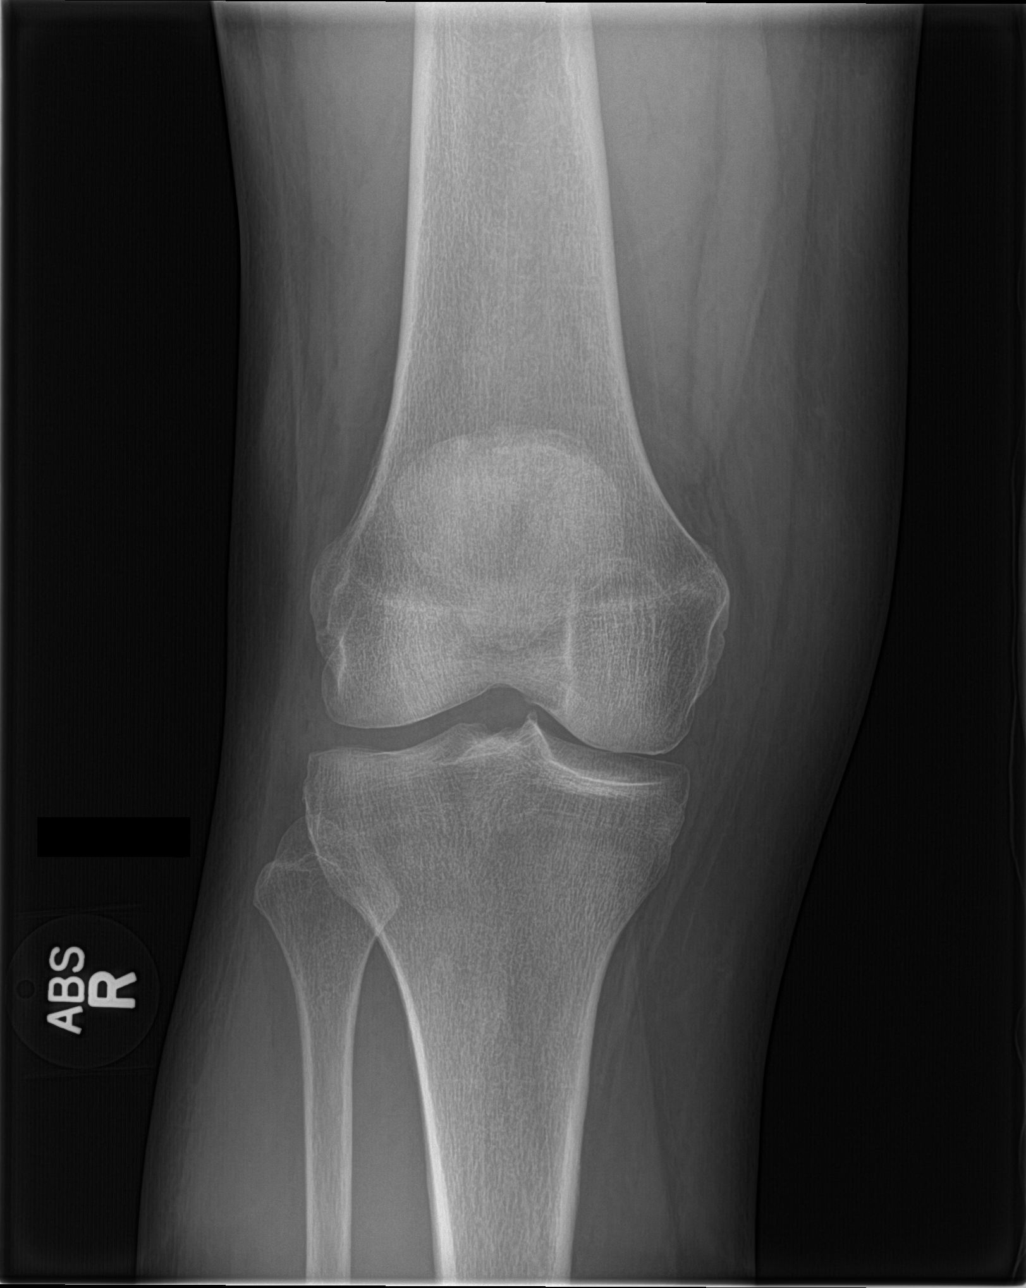
[im 2/4]
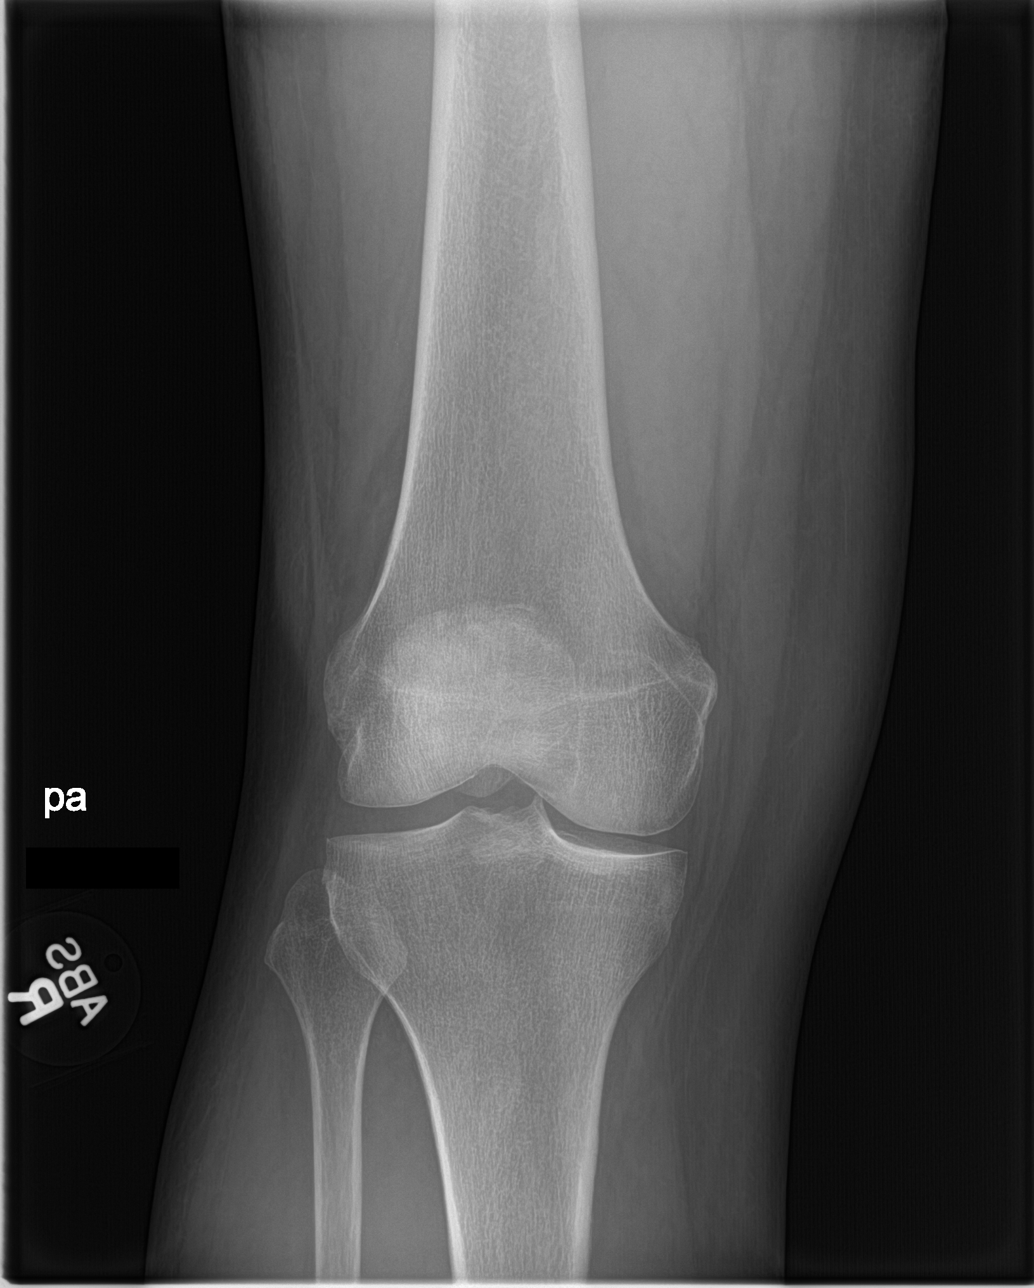
[im 3/4]
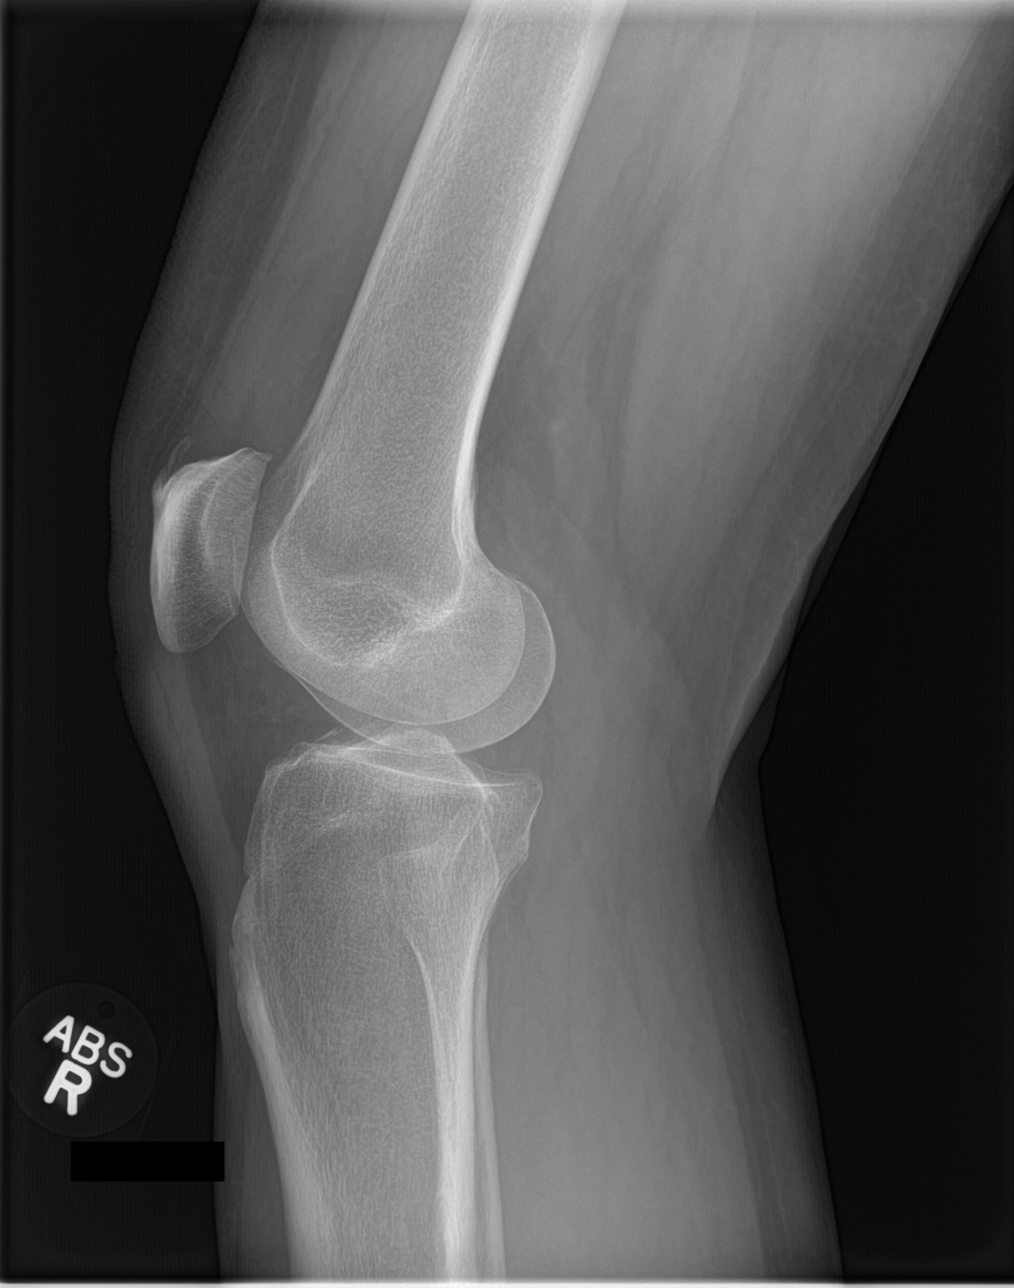
[im 4/4]
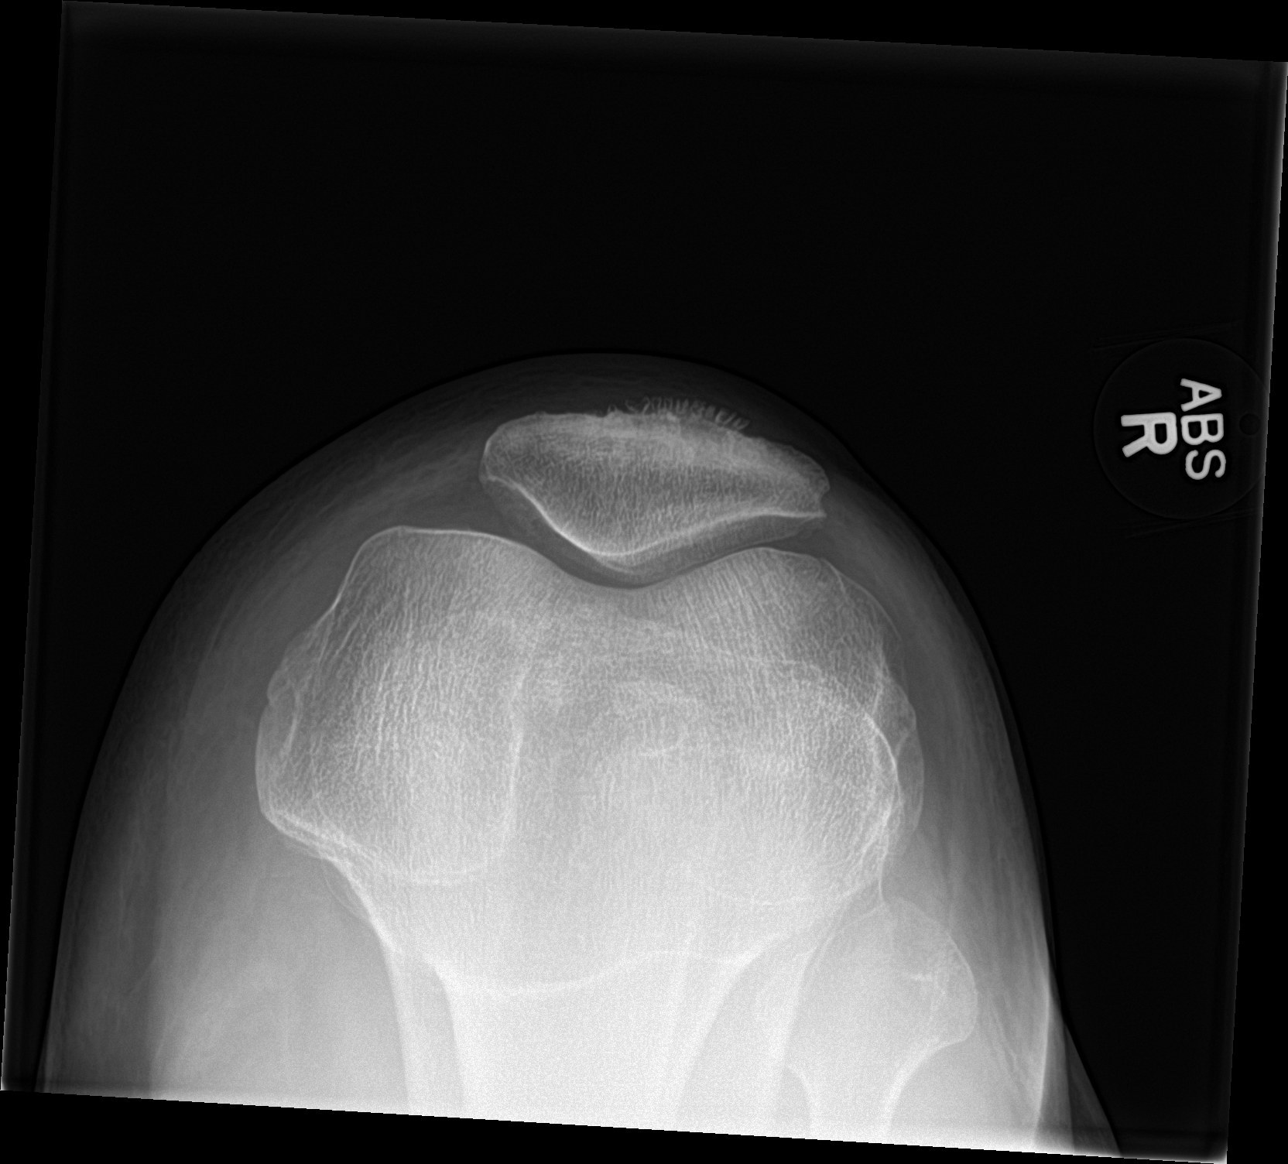

[4 of 4 positions shown; findings below may reference images not displayed]

FINDINGS: Small joint effusion. No evidence of lipohemarthrosis. No fracture
or dislocation. There is mild degenerative change involving the
patellar femoral joints and medial compartments of the knee with
joint space loss, subchondral sclerosis an articular surface
irregularity. There is minimal spurring of the tibial spines.
Minimal enthesopathic change involving the superior pole the
patella. No evidence of chondrocalcinosis. Regional soft tissues
appear normal.
IMPRESSION: 1. Small joint effusion.  Otherwise, no acute findings.
2. Mild degenerative change of the knee.

## 2016-04-08 ENCOUNTER — Ambulatory Visit (INDEPENDENT_AMBULATORY_CARE_PROVIDER_SITE_OTHER): Payer: BLUE CROSS/BLUE SHIELD | Admitting: Family Medicine

## 2016-04-08 ENCOUNTER — Encounter: Payer: Self-pay | Admitting: Family Medicine

## 2016-04-08 VITALS — BP 122/84 | HR 92 | Temp 98.1°F | Resp 16 | Wt 230.0 lb

## 2016-04-08 DIAGNOSIS — R600 Localized edema: Secondary | ICD-10-CM | POA: Diagnosis not present

## 2016-04-08 MED ORDER — HYDROCHLOROTHIAZIDE 25 MG PO TABS: ORAL_TABLET | ORAL | 2 refills | Status: DC

## 2016-04-08 NOTE — Patient Instructions (Signed)
Call me at the turn of the year about 3 month refills. If using medication frequently would check labs.

## 2016-04-08 NOTE — Progress Notes (Signed)
Subjective:     Patient ID: Evelyn Wiley, female   DOB: Oct 25, 1955, 60 y.o.   MRN: [id number]  HPI  Chief Complaint  Patient presents with  . Edema    FU from 03/18/2016. Was started on HCTZ 25 mg qd for pedal edema. Pt also had elevated BP at that time. Was 138/94. Today BP is 122/84. Swelling has improved. Pt reports the medication worked really well the first 2 days she took it. States she lost 8 pounds "and could see the cones on the top of my feet". Swelling has since returned, though is mild.  Wishes to stay on medication. Also reports hot flashes have improved with estradiol.   Review of Systems     Objective:   Physical Exam  Constitutional: She appears well-developed and well-nourished. No distress.  Cardiovascular: Normal rate and regular rhythm.   Pulmonary/Chest: Breath sounds normal.  Musculoskeletal: She exhibits no edema (of lower extremities).       Assessment:    1. Pedal edema - hydrochlorothiazide (HYDRODIURIL) 25 MG tablet; One daily as needed for foot/ankle swelling  Dispense: 30 tablet; Refill: 2    Plan:    Discussed checking labs if using frequently.

## 2016-12-04 ENCOUNTER — Other Ambulatory Visit: Payer: Self-pay | Admitting: Family Medicine

## 2016-12-04 NOTE — Telephone Encounter (Signed)
Have her set up an office visit.

## 2016-12-04 NOTE — Telephone Encounter (Signed)
Medication last filled 07/20/15 last office visit was 01/09/15 for depression and anxiety. Please review, would you like me to have patient come back in the office to follow up? kW

## 2016-12-04 NOTE — Telephone Encounter (Signed)
I am willing to call in #30 as previously. [facility] [place] [facility].

## 2016-12-04 NOTE — Telephone Encounter (Signed)
Spoke with patient on the phone she states that she is very busy with work and would not be able to take time off for office visit for another 4-6 weeks. Patient states that she normally just given prescription without coming in, I advised her that it would be best to follow up and see how her condition is now. Patient request that you send in Rx until she is able to come in office to follow up. KW

## 2016-12-04 NOTE — Telephone Encounter (Signed)
Pt contacted office for refill request on the following medications:  90 day supply.  [facility].  ZO#[phone]/WJCB#[phone]/MW  ALPRAZolam (XANAX) 1 MG tablet

## 2016-12-04 NOTE — Telephone Encounter (Signed)
LTMCB-KW

## 2016-12-05 ENCOUNTER — Ambulatory Visit (INDEPENDENT_AMBULATORY_CARE_PROVIDER_SITE_OTHER): Payer: Managed Care, Other (non HMO) | Admitting: Family Medicine

## 2016-12-05 ENCOUNTER — Encounter: Payer: Self-pay | Admitting: Family Medicine

## 2016-12-05 ENCOUNTER — Telehealth: Payer: Self-pay | Admitting: Family Medicine

## 2016-12-05 VITALS — BP 142/118 | HR 76 | Temp 98.1°F | Resp 16 | Ht 67.0 in | Wt 252.6 lb

## 2016-12-05 DIAGNOSIS — R635 Abnormal weight gain: Secondary | ICD-10-CM

## 2016-12-05 DIAGNOSIS — R300 Dysuria: Secondary | ICD-10-CM

## 2016-12-05 DIAGNOSIS — W57XXXA Bitten or stung by nonvenomous insect and other nonvenomous arthropods, initial encounter: Secondary | ICD-10-CM

## 2016-12-05 DIAGNOSIS — I10 Essential (primary) hypertension: Secondary | ICD-10-CM

## 2016-12-05 DIAGNOSIS — Z13220 Encounter for screening for lipoid disorders: Secondary | ICD-10-CM | POA: Diagnosis not present

## 2016-12-05 DIAGNOSIS — Z131 Encounter for screening for diabetes mellitus: Secondary | ICD-10-CM | POA: Diagnosis not present

## 2016-12-05 DIAGNOSIS — R5383 Other fatigue: Secondary | ICD-10-CM | POA: Diagnosis not present

## 2016-12-05 DIAGNOSIS — F419 Anxiety disorder, unspecified: Secondary | ICD-10-CM

## 2016-12-05 LAB — POCT URINALYSIS DIPSTICK
Bilirubin, UA: NEGATIVE
Blood, UA: NEGATIVE
GLUCOSE UA: NEGATIVE
KETONES UA: NEGATIVE
Leukocytes, UA: NEGATIVE
Nitrite, UA: NEGATIVE
PROTEIN UA: NEGATIVE
Urobilinogen, UA: 0.2 E.U./dL
pH, UA: 7.5 (ref 5.0–8.0)

## 2016-12-05 MED ORDER — ALPRAZOLAM 1 MG PO TABS: 1.0000 mg | ORAL_TABLET | Freq: Three times a day (TID) | ORAL | 0 refills | Status: DC | PRN

## 2016-12-05 MED ORDER — TRIAMTERENE-HCTZ 37.5-25 MG PO CAPS: 1.0000 | ORAL_CAPSULE | Freq: Every day | ORAL | 1 refills | Status: DC

## 2016-12-05 NOTE — Progress Notes (Signed)
Subjective:     Patient ID: Evelyn Wiley, female   DOB: [birth year], 61 y.o.   MRN: [id number]  HPI  Chief Complaint  Patient presents with  . Tick Removal    Patient comes in office today with concerns of tick bite that occured in April, patient reports that she found a tick on her lower back and left breast and pulled them off with her fingers but states that where tick was on her back she has had itching and clear drainage. Patient states for the past several weeks she has had symptoms of fatigue and headache, patient has concerns that tick head is still embedded in her skin on her back.   . Weight Gain    Patient would like to address increased weight gain in the past 6 months. Patient reports that she feels that she has gained between 40-60lbs.   She is also anxious today with multiple somatic sx. She also has intermittent dysuria. 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 Tends to snack more on this schedule. Little exercise with weight contributing to foot pain, heartburn, fatigue, blood pressure, and headaches. Currently using HCTZ intermittently for foot swelling. Not taking estradiol. Both parents have HTN. Have discussed with her finding a primary physician [place] [facility] [place].   Review of Systems     Objective:   Physical Exam  Constitutional: She appears well-developed and well-nourished. She appears distressed (anxious).  Cardiovascular: Normal rate and regular rhythm.   Pulmonary/Chest: Breath sounds normal.  Musculoskeletal: She exhibits no edema (of lower extremities).  Skin:  Lower back with 3-4 mm papule c/w insect bite. No drainage or inflammatory flare. No f.b.visualized.       Assessment:    1. Dysuria: reassured U/A normal - POCT urinalysis dipstick  2. Essential hypertension - Comprehensive metabolic panel - triamterene-hydrochlorothiazide (DYAZIDE) 37.5-25 MG capsule; Take 1 each (1 capsule total)  by mouth daily.  Dispense: 90 capsule; Refill: 1  3. Screening for diabetes mellitus - Comprehensive metabolic panel  4. Excessive weight gain: suspect due to sedentary lifestyle and excessive calories - T4, free - TSH  5. Tick bite, initial encounter  6. Screening for cholesterol level - Lipid panel  7. Other fatigue - CBC with Differential/Platelet - VITAMIN D 25 Hydroxy (Vit-D Deficiency, Fractures)  8. Anxiety - ALPRAZolam (XANAX) 1 MG tablet; Take 1 tablet (1 mg total) by mouth 3 (three) times daily as needed for anxiety.  Dispense: 30 tablet; Refill: 0    Plan:    Further f/u in 2 weeks and pending lab work. Use hydrocortisone cream for her insect bite.

## 2016-12-05 NOTE — Telephone Encounter (Signed)
Please call in alprazolam [facility] [place]

## 2016-12-05 NOTE — Telephone Encounter (Signed)
Pt states she was in today and discuss having heartburn.  Pt is asking if she can get a Rx for this.  [facility] [place]  WU#[phone]/NFCB#[phone]/MW

## 2016-12-05 NOTE — Telephone Encounter (Signed)
Patient advised.

## 2016-12-05 NOTE — Patient Instructions (Addendum)
Try hydrocortisone cream twice daily for your tick bite. Try to work up to 30 minutes of walking daily. We will call you with the lab results.

## 2016-12-05 NOTE — Telephone Encounter (Signed)
Spoke with patient on the phone [facility] [place]. KW

## 2016-12-05 NOTE — Telephone Encounter (Signed)
Take Zantac 150 or Pepcid 20 mg.over the counter daily along with  TUMS as needed

## 2016-12-06 ENCOUNTER — Ambulatory Visit: Payer: Self-pay | Admitting: Physician Assistant

## 2016-12-09 ENCOUNTER — Telehealth: Payer: Self-pay | Admitting: Family Medicine

## 2016-12-09 NOTE — Telephone Encounter (Signed)
Pt called saying she is having really bad leg cramps.  She thinks it is from the Dyazide.  Please advise.  [phone] you can leave a message if she don't answer.  [facility] [place].  Thanks Fortune Brandsteri

## 2016-12-09 NOTE — Telephone Encounter (Signed)
Please review-aa 

## 2016-12-10 ENCOUNTER — Other Ambulatory Visit: Payer: Self-pay | Admitting: Family Medicine

## 2016-12-10 MED ORDER — AMLODIPINE BESYLATE 5 MG PO TABS: 5.0000 mg | ORAL_TABLET | Freq: Every day | ORAL | 0 refills | Status: DC

## 2016-12-10 NOTE — Telephone Encounter (Signed)
Stop the dyazide. I have sent in amlodipine to take one daily.

## 2016-12-10 NOTE — Telephone Encounter (Signed)
I have sent in the 90 day supple as well.

## 2016-12-10 NOTE — Telephone Encounter (Signed)
Pt advised. Friday is the last day she is working at her job, ans she will be losing her insurance. She is asking for a 90 day rx of amlodipine be sent to [facility] so she can make it until she has insurance again.

## 2016-12-16 LAB — COMPREHENSIVE METABOLIC PANEL
A/G RATIO: 1.7 (ref 1.2–2.2)
ALT: 56 IU/L — AB (ref 0–32)
AST: 35 IU/L (ref 0–40)
Albumin: 4.5 g/dL (ref 3.5–5.5)
Alkaline Phosphatase: 63 IU/L (ref 39–117)
BUN/Creatinine Ratio: 15 (ref 9–23)
BUN: 10 mg/dL (ref 6–24)
Bilirubin Total: 0.3 mg/dL (ref 0.0–1.2)
CALCIUM: 9.3 mg/dL (ref 8.7–10.2)
CO2: 23 mmol/L (ref 20–29)
CREATININE: 0.65 mg/dL (ref 0.57–1.00)
Chloride: 101 mmol/L (ref 96–106)
GFR, EST AFRICAN AMERICAN: 112 mL/min/{1.73_m2} (ref >59)
GFR, EST NON AFRICAN AMERICAN: 97 mL/min/{1.73_m2} (ref >59)
GLUCOSE: 107 mg/dL — AB (ref 65–99)
Globulin, Total: 2.7 g/dL (ref 1.5–4.5)
Potassium: 4.6 mmol/L (ref 3.5–5.2)
Sodium: 141 mmol/L (ref 134–144)
TOTAL PROTEIN: 7.2 g/dL (ref 6.0–8.5)

## 2016-12-16 LAB — CBC WITH DIFFERENTIAL/PLATELET
BASOS: 1 %
Basophils Absolute: 0 10*3/uL (ref 0.0–0.2)
EOS (ABSOLUTE): 0 10*3/uL (ref 0.0–0.4)
EOS: 0 %
HEMOGLOBIN: 13.7 g/dL (ref 11.1–15.9)
Hematocrit: 41.5 % (ref 34.0–46.6)
Immature Grans (Abs): 0 10*3/uL (ref 0.0–0.1)
Immature Granulocytes: 1 %
LYMPHS ABS: 1.8 10*3/uL (ref 0.7–3.1)
Lymphs: 36 %
MCH: 30.4 pg (ref 26.6–33.0)
MCHC: 33 g/dL (ref 31.5–35.7)
MCV: 92 fL (ref 79–97)
MONOCYTES: 6 %
MONOS ABS: 0.3 10*3/uL (ref 0.1–0.9)
NEUTROS ABS: 2.9 10*3/uL (ref 1.4–7.0)
Neutrophils: 56 %
Platelets: 211 10*3/uL (ref 150–379)
RBC: 4.51 x10E6/uL (ref 3.77–5.28)
RDW: 13.7 % (ref 12.3–15.4)
WBC: 5.1 10*3/uL (ref 3.4–10.8)

## 2016-12-16 LAB — TSH: TSH: 3.9 u[IU]/mL (ref 0.450–4.500)

## 2016-12-16 LAB — VITAMIN D 25 HYDROXY (VIT D DEFICIENCY, FRACTURES): Vit D, 25-Hydroxy: 19.5 ng/mL — ABNORMAL LOW (ref 30.0–100.0)

## 2016-12-16 LAB — LIPID PANEL
CHOL/HDL RATIO: 5.9 ratio — AB (ref 0.0–4.4)
Cholesterol, Total: 212 mg/dL — ABNORMAL HIGH (ref 100–199)
HDL: 36 mg/dL — AB (ref >39)
LDL Calculated: 101 mg/dL — ABNORMAL HIGH (ref 0–99)
Triglycerides: 375 mg/dL — ABNORMAL HIGH (ref 0–149)
VLDL CHOLESTEROL CAL: 75 mg/dL — AB (ref 5–40)

## 2016-12-16 LAB — T4, FREE: FREE T4: 0.98 ng/dL (ref 0.82–1.77)

## 2016-12-19 ENCOUNTER — Ambulatory Visit: Payer: Managed Care, Other (non HMO) | Admitting: Family Medicine

## 2017-01-14 ENCOUNTER — Other Ambulatory Visit: Payer: Self-pay | Admitting: Family Medicine

## 2017-06-20 MED ORDER — POLYETHYLENE GLYCOL 3350 17 G PO PACK
17.00 | PACK | ORAL | Status: DC
Start: ? — End: 2017-06-20

## 2017-06-20 MED ORDER — ACETAMINOPHEN 325 MG PO TABS
650.00 mg | ORAL_TABLET | ORAL | Status: DC
Start: ? — End: 2017-06-20

## 2017-06-20 MED ORDER — NITROGLYCERIN 0.4 MG SL SUBL
0.40 mg | SUBLINGUAL_TABLET | SUBLINGUAL | Status: DC
Start: ? — End: 2017-06-20

## 2017-06-20 MED ORDER — ASPIRIN 81 MG PO CHEW
81.00 mg | CHEWABLE_TABLET | ORAL | Status: DC
Start: 2017-06-21 — End: 2017-06-20

## 2017-06-20 MED ORDER — TRIAMTERENE-HCTZ 37.5-25 MG PO TABS
1.00 | ORAL_TABLET | ORAL | Status: DC
Start: 2017-06-21 — End: 2017-06-20

## 2017-06-20 MED ORDER — ATORVASTATIN CALCIUM 40 MG PO TABS
40.00 mg | ORAL_TABLET | ORAL | Status: DC
Start: ? — End: 2017-06-20

## 2017-09-12 ENCOUNTER — Telehealth: Payer: Self-pay | Admitting: Family Medicine

## 2017-09-12 ENCOUNTER — Other Ambulatory Visit: Payer: Self-pay | Admitting: Family Medicine

## 2017-09-12 DIAGNOSIS — I10 Essential (primary) hypertension: Secondary | ICD-10-CM

## 2017-09-12 MED ORDER — TRIAMTERENE-HCTZ 37.5-25 MG PO CAPS: 1.0000 | ORAL_CAPSULE | Freq: Every day | ORAL | 1 refills | Status: DC

## 2017-09-12 NOTE — Telephone Encounter (Signed)
Please advise. KW 

## 2017-09-12 NOTE — Telephone Encounter (Signed)
Pt contacted office for refill request on the following medications:  triamterene-hydrochlorothiazide (DYAZIDE) 37.5-25 MG capsule  [facility] [place]  Pt stated she is completely out of the medication. Pt stated that she currently doesn't have insurance and can't pay for an OV out of pocket at this time. Pt requested that Nadine Counts give her enough medication to last a few months and she will call back to schedule appt as soon as she can.  Last Rx: 12/05/16 (90 day supply with 1 refill) LOV: 12/05/16 Please advise. Thanks TNP

## 2017-09-12 NOTE — Telephone Encounter (Signed)
done

## 2017-12-26 ENCOUNTER — Other Ambulatory Visit: Payer: Self-pay | Admitting: Family Medicine

## 2017-12-26 ENCOUNTER — Telehealth: Payer: Self-pay

## 2017-12-26 DIAGNOSIS — F419 Anxiety disorder, unspecified: Secondary | ICD-10-CM

## 2017-12-26 MED ORDER — ALPRAZOLAM 1 MG PO TABS: 1.0000 mg | ORAL_TABLET | Freq: Three times a day (TID) | ORAL | 0 refills | Status: AC | PRN

## 2017-12-26 NOTE — Telephone Encounter (Signed)
I will fill her medication this time but she will need to find a doctor in her area as we discussed at her last office visit.

## 2017-12-26 NOTE — Telephone Encounter (Signed)
Last office date and last time prescription was filled was on 12/05/16, please review and advise. KW

## 2017-12-26 NOTE — Telephone Encounter (Signed)
Patient is requesting a refill on ALPRAZolam Prudy Feeler(XANAX) 1 MG tablet [facility]. CB# [phone]

## 2017-12-26 NOTE — Telephone Encounter (Signed)
Patient advised.KW 

## 2017-12-26 NOTE — Telephone Encounter (Signed)
-----   Message from Anola Gurneyobert Chauvin, [place] sent at 12/26/2017 11:38 AM EDT ----- Please call in Xanax as updated in the EMR to [facility] in [place] (fingerpint identifier closed up on me)

## 2017-12-26 NOTE — Telephone Encounter (Signed)
Prescription has been called into pharmacy voicemail. KW

## 2018-05-25 ENCOUNTER — Other Ambulatory Visit: Payer: Self-pay | Admitting: Family Medicine

## 2018-05-25 DIAGNOSIS — I10 Essential (primary) hypertension: Secondary | ICD-10-CM

## 2019-01-01 ENCOUNTER — Telehealth: Payer: Self-pay | Admitting: Family Medicine

## 2019-01-01 NOTE — Telephone Encounter (Signed)
Appointment scheduled.

## 2019-01-01 NOTE — Telephone Encounter (Signed)
Patient has not been seen in the clinic in 2 years. Last visit 12/05/2016 had uncontrolled HTN. She will need to make a CPE and follow up for me to order anything.

## 2019-01-01 NOTE — Telephone Encounter (Signed)
Pt need her mammogram order.  Please send order to [facility].  Thanks, [employer]

## 2019-02-05 ENCOUNTER — Other Ambulatory Visit: Payer: Self-pay

## 2019-02-05 ENCOUNTER — Encounter: Payer: Self-pay | Admitting: Physician Assistant

## 2019-02-05 ENCOUNTER — Ambulatory Visit (INDEPENDENT_AMBULATORY_CARE_PROVIDER_SITE_OTHER): Payer: 59 | Admitting: Physician Assistant

## 2019-02-05 VITALS — BP 148/91 | HR 80 | Temp 97.3°F | Resp 16 | Ht 67.0 in | Wt 234.8 lb

## 2019-02-05 DIAGNOSIS — N3281 Overactive bladder: Secondary | ICD-10-CM

## 2019-02-05 DIAGNOSIS — Z1211 Encounter for screening for malignant neoplasm of colon: Secondary | ICD-10-CM

## 2019-02-05 DIAGNOSIS — Z1159 Encounter for screening for other viral diseases: Secondary | ICD-10-CM | POA: Diagnosis not present

## 2019-02-05 DIAGNOSIS — Z114 Encounter for screening for human immunodeficiency virus [HIV]: Secondary | ICD-10-CM

## 2019-02-05 DIAGNOSIS — I10 Essential (primary) hypertension: Secondary | ICD-10-CM | POA: Diagnosis not present

## 2019-02-05 DIAGNOSIS — F419 Anxiety disorder, unspecified: Secondary | ICD-10-CM

## 2019-02-05 DIAGNOSIS — K580 Irritable bowel syndrome with diarrhea: Secondary | ICD-10-CM

## 2019-02-05 LAB — HM MAMMOGRAPHY

## 2019-02-05 MED ORDER — HYDROXYZINE HCL 10 MG PO TABS: 10.0000 mg | ORAL_TABLET | Freq: Three times a day (TID) | ORAL | 0 refills | Status: DC | PRN

## 2019-02-05 MED ORDER — MIRABEGRON ER 25 MG PO TB24: 25.0000 mg | ORAL_TABLET | Freq: Every day | ORAL | 1 refills | Status: DC

## 2019-02-05 MED ORDER — AMITRIPTYLINE HCL 10 MG PO TABS: 10.0000 mg | ORAL_TABLET | Freq: Every day | ORAL | 1 refills | Status: DC

## 2019-02-05 MED ORDER — TRIAMTERENE-HCTZ 37.5-25 MG PO CAPS: 1.0000 | ORAL_CAPSULE | Freq: Every day | ORAL | 1 refills | Status: AC

## 2019-02-05 NOTE — Patient Instructions (Signed)

## 2019-02-05 NOTE — Progress Notes (Signed)
Patient: Evelyn Wiley Female    DOB: December 28, 1955   63 y.o.   MRN: [id number] Visit Date: 02/05/2019  Today's Provider: Trey Sailors, PA-C   Chief Complaint  Patient presents with  . 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 She will lose her insurance for three years.  Mammogram: today Hysterectomy: precancerous cells  Tetanus shot: in the past ten years  myrbetriq     Hypertension, follow-up:  BP Readings from Last 3 Encounters:  02/05/19 (!) 148/91  12/05/16 (!) 142/118  04/08/16 122/84    She was last seen for hypertension 1 years ago.  BP at that visit was 142/118. She has not taken BP medication consistently. She is currently taking Dyazide.  Management changes since that visit include no changes. She reports excellent compliance with treatment. She is not having side effects.  She is exercising. She is adherent to low salt diet.   Outside blood pressures are not being checked. She is experiencing none.  Patient denies chest pain.   Cardiovascular risk factors include hypertension and obesity (BMI >= 30 kg/m2).  Use of agents associated with hypertension: none.     Weight trend: decreasing steadily Wt Readings from Last 3 Encounters:  02/05/19 234 lb 12.8 oz (106.5 kg)  12/05/16 252 lb 9.6 oz (114.6 kg)  04/08/16 230 lb (104.3 kg)    Current diet: in general, a "healthy" diet    Urgen Incontinence/Fecal Incontinence: Patient describes a sudden urge to void frequently followed by incontinence. She also reports a long history of IBS-D and frequent loose stools. Last colonoscopy 2009 was normal. She reports she had a surgery and was told that her muscles are not strong enough to keep stool in. She saw [facility] Urogyn and pelvic reocnstructive surgery in [place] in 2014. She had poor pelvic floor strength and puborectalis strength and anal sphincter  strength on exam.   Impression as listed below from 11/06/2012 [facility] visit as below.   This is a 63 y.o. with: 1. Severe recurrent fecal urgency and FI that I think is primarily related to her abnormal stool consistency and motility and then is compounded by her sphincteric dysfunction. Until we get her bowel motility under better control, she should expect to have ongoing FI. 2. Urge UI with irritative voiding sx and risk factors for bladder cancer. 3. 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 Heavy smoker  Plan: 1. We discussed the different etiologies for fecal incontinence. We reviewed treatment options including medical management with anti-diarrhea medication, fiber supplementation, physical therapy, sacral neuromodulation, and surgery. We discussed the importance of stool bulking with Citrucel and management of any looser stools with titrated Immodium. In addition, we discussed the importance of avoidance of repetitive wiping and rather advised using a cotton ball at the anal verge to act as a wick. All questions were answered. 2. Schedule UDT and cysto to investigate bladder sx- the risks were discussed in detail. 3. Pt was given informational material about Interstim which could help both her urge UI and FI. We must first control bowel motility.   ------------------------------------------------------------------------   No Known Allergies   Current Outpatient Medications:  .  ALPRAZolam (XANAX) 1 MG tablet, Take 1 tablet (1 mg total) by mouth 3 (three) times daily as needed for anxiety., Disp: 30 tablet, Rfl: 0 .  triamterene-hydrochlorothiazide (DYAZIDE) 37.5-25 MG capsule, TAKE 1  CAPSULE BY MOUTH DAILY, Disp: 90 capsule, Rfl: 1  Review of Systems  Constitutional: Negative.   Respiratory: Negative.   Cardiovascular: Negative.     Social History   Tobacco Use  . Smoking status: Former Smoker    Types: Cigarettes    Quit date: 04/21/2015    Years since quitting: 3.7  .  Smokeless tobacco: Never Used  Substance Use Topics  . Alcohol use: No    Alcohol/week: 0.0 standard drinks      Objective:   BP (!) 148/91 (BP Location: Left Arm, Patient Position: Sitting, Cuff Size: Large)   Pulse 80   Temp (!) 97.3 F (36.3 C) (Temporal)   Resp 16   Ht 5\' 7"  (1.702 m)   Wt 234 lb 12.8 oz (106.5 kg)   BMI 36.77 kg/m  Vitals:   02/05/19 1010  BP: (!) 148/91  Pulse: 80  Resp: 16  Temp: (!) 97.3 F (36.3 C)  TempSrc: Temporal  Weight: 234 lb 12.8 oz (106.5 kg)  Height: 5\' 7"  (1.702 m)  Body mass index is 36.77 kg/m.   Physical Exam Constitutional:      Appearance: Normal appearance.  Cardiovascular:     Rate and Rhythm: Normal rate and regular rhythm.     Heart sounds: Normal heart sounds.  Pulmonary:     Effort: Pulmonary effort is normal.     Breath sounds: Normal breath sounds.  Skin:    General: Skin is warm and dry.  Neurological:     Mental Status: She is alert and oriented to person, place, and time. Mental status is at baseline.  Psychiatric:        Mood and Affect: Mood normal.        Behavior: Behavior normal.      No results found for any visits on 02/05/19.     Assessment & Plan    1. Essential hypertension  Not taking medication consistently. Needs to take her medication daily. She has been instructed that the medication she is on requires at least one yearly visit and labwork. If she is unable to afford our discounted self-pay rate, she should look into free clinics or sliding scale clinics like [facility]. She reports she has begun to look into clinics like the [facility] in case this happens.   - Comprehensive Metabolic Panel (CMET) - CBC With Differential - TSH - Lipid Profile - triamterene-hydrochlorothiazide (DYAZIDE) 37.5-25 MG capsule; Take 1 each (1 capsule total) by mouth daily.  Dispense: 90 capsule; Refill: 1  2. Encounter for hepatitis C screening test for low risk patient  - Hepatitis c antibody  (reflex)  3. Encounter for screening for HIV  - HIV antibody (with reflex)  4. Overactive bladder  More urge incontinence. This has been a long standing issue. I think it somewhat counterintuitive that is on a diuretic with this issue however she reports she experiences swelling without the diuretic. Trial of medication as below.   - mirabegron ER (MYRBETRIQ) 25 MG TB24 tablet; Take 1 tablet (25 mg total) by mouth daily.  Dispense: 90 tablet; Refill: 1  5. Irritable bowel syndrome with diarrhea  Another longstanding issue multifactorial in etiology. Recommend fiber for bulking. May try medication as below to help slow transit time. Counseled on side effects.   - amitriptyline (ELAVIL) 10 MG tablet; Take 1 tablet (10 mg total) by mouth at bedtime.  Dispense: 90 tablet; Refill: 1  6. Colon cancer screening  - Cologuard  7. Anxiety  Counseled I do not prescribe Xanax. Will give medication as below, counseled on sedation.   - hydrOXYzine (ATARAX/VISTARIL) 10 MG tablet; Take 1 tablet (10 mg total) by mouth 3 (three) times daily as needed.  Dispense: 30 tablet; Refill: 0  The entirety of the information documented in the History of Present Illness, Review of Systems and Physical Exam were personally obtained by me. Portions of this information were initially documented by Lynford Humphrey, CMA and reviewed by me for thoroughness and accuracy.   F/u 1 year HTN     Trinna Post, PA-C  [facility]

## 2019-02-06 LAB — CBC WITH DIFFERENTIAL
Basophils Absolute: 0.1 10*3/uL (ref 0.0–0.2)
Basos: 1 %
EOS (ABSOLUTE): 0 10*3/uL (ref 0.0–0.4)
Eos: 0 %
Hematocrit: 41.4 % (ref 34.0–46.6)
Hemoglobin: 13.9 g/dL (ref 11.1–15.9)
Immature Grans (Abs): 0 10*3/uL (ref 0.0–0.1)
Immature Granulocytes: 0 %
Lymphocytes Absolute: 2 10*3/uL (ref 0.7–3.1)
Lymphs: 39 %
MCH: 29.3 pg (ref 26.6–33.0)
MCHC: 33.6 g/dL (ref 31.5–35.7)
MCV: 87 fL (ref 79–97)
Monocytes Absolute: 0.3 10*3/uL (ref 0.1–0.9)
Monocytes: 6 %
Neutrophils Absolute: 2.8 10*3/uL (ref 1.4–7.0)
Neutrophils: 54 %
RBC: 4.74 x10E6/uL (ref 3.77–5.28)
RDW: 12.7 % (ref 11.7–15.4)
WBC: 5.2 10*3/uL (ref 3.4–10.8)

## 2019-02-06 LAB — COMPREHENSIVE METABOLIC PANEL
ALT: 28 IU/L (ref 0–32)
AST: 22 IU/L (ref 0–40)
Albumin/Globulin Ratio: 1.9 (ref 1.2–2.2)
Albumin: 4.7 g/dL (ref 3.8–4.8)
Alkaline Phosphatase: 80 IU/L (ref 39–117)
BUN/Creatinine Ratio: 19 (ref 12–28)
BUN: 14 mg/dL (ref 8–27)
Bilirubin Total: 0.4 mg/dL (ref 0.0–1.2)
CO2: 24 mmol/L (ref 20–29)
Calcium: 10 mg/dL (ref 8.7–10.3)
Chloride: 100 mmol/L (ref 96–106)
Creatinine, Ser: 0.74 mg/dL (ref 0.57–1.00)
GFR calc Af Amer: 101 mL/min/{1.73_m2} (ref >59)
GFR calc non Af Amer: 88 mL/min/{1.73_m2} (ref >59)
Globulin, Total: 2.5 g/dL (ref 1.5–4.5)
Glucose: 140 mg/dL — ABNORMAL HIGH (ref 65–99)
Potassium: 4.1 mmol/L (ref 3.5–5.2)
Sodium: 140 mmol/L (ref 134–144)
Total Protein: 7.2 g/dL (ref 6.0–8.5)

## 2019-02-06 LAB — LIPID PANEL
Chol/HDL Ratio: 5.6 ratio — ABNORMAL HIGH (ref 0.0–4.4)
Cholesterol, Total: 200 mg/dL — ABNORMAL HIGH (ref 100–199)
HDL: 36 mg/dL — ABNORMAL LOW (ref >39)
LDL Chol Calc (NIH): 101 mg/dL — ABNORMAL HIGH (ref 0–99)
Triglycerides: 374 mg/dL — ABNORMAL HIGH (ref 0–149)
VLDL Cholesterol Cal: 63 mg/dL — ABNORMAL HIGH (ref 5–40)

## 2019-02-06 LAB — HIV ANTIBODY (ROUTINE TESTING W REFLEX): HIV Screen 4th Generation wRfx: NONREACTIVE

## 2019-02-06 LAB — TSH: TSH: 3.15 u[IU]/mL (ref 0.450–4.500)

## 2019-02-06 LAB — HCV COMMENT:

## 2019-02-06 LAB — HEPATITIS C ANTIBODY (REFLEX): HCV Ab: 0.1 s/co ratio (ref 0.0–0.9)

## 2019-02-11 ENCOUNTER — Telehealth: Payer: Self-pay

## 2019-02-11 ENCOUNTER — Encounter: Payer: Self-pay | Admitting: Physician Assistant

## 2019-02-11 NOTE — Telephone Encounter (Signed)
-----   Message from Trinna Post, [place] sent at 02/11/2019  9:39 AM EDT ----- Patient has diabetes. Patient says she is planning on retiring early and going without insurance for three years until she qualifies for Medicare. She needs to be followed by somebody for her diabetes. I am happy to see her back in follow up but it may be cost prohibitive. I suggest she establish with the free clinic she was talking about because she is going to need regular follow up every 3 months.

## 2019-02-11 NOTE — Telephone Encounter (Signed)
Tried calling; pt's mailbox is full.   Thanks,   -Laura  

## 2019-02-12 NOTE — Telephone Encounter (Signed)
Patient advised as below.  

## 2019-02-12 NOTE — Telephone Encounter (Signed)
LMTCB 02/12/2019  Thanks,   -Kristeen Lantz  

## 2019-02-12 NOTE — Telephone Encounter (Signed)
Pt returned missed call.  Please call pt back. ° °Thanks, °TGH °

## 2019-02-15 ENCOUNTER — Ambulatory Visit: Payer: Self-pay | Admitting: Physician Assistant

## 2019-02-16 LAB — HGB A1C W/O EAG: Hgb A1c MFr Bld: 7.4 % — ABNORMAL HIGH (ref 4.8–5.6)

## 2019-02-16 LAB — SPECIMEN STATUS REPORT

## 2019-02-19 ENCOUNTER — Ambulatory Visit (INDEPENDENT_AMBULATORY_CARE_PROVIDER_SITE_OTHER): Payer: 59 | Admitting: Physician Assistant

## 2019-02-19 ENCOUNTER — Encounter: Payer: Self-pay | Admitting: Physician Assistant

## 2019-02-19 ENCOUNTER — Other Ambulatory Visit: Payer: Self-pay

## 2019-02-19 VITALS — BP 120/74 | HR 73 | Temp 96.8°F | Resp 16 | Ht 67.0 in | Wt 231.2 lb

## 2019-02-19 DIAGNOSIS — E1169 Type 2 diabetes mellitus with other specified complication: Secondary | ICD-10-CM

## 2019-02-19 DIAGNOSIS — Z23 Encounter for immunization: Secondary | ICD-10-CM

## 2019-02-19 DIAGNOSIS — E119 Type 2 diabetes mellitus without complications: Secondary | ICD-10-CM | POA: Diagnosis not present

## 2019-02-19 DIAGNOSIS — E1159 Type 2 diabetes mellitus with other circulatory complications: Secondary | ICD-10-CM

## 2019-02-19 DIAGNOSIS — E785 Hyperlipidemia, unspecified: Secondary | ICD-10-CM

## 2019-02-19 DIAGNOSIS — I152 Hypertension secondary to endocrine disorders: Secondary | ICD-10-CM

## 2019-02-19 DIAGNOSIS — I10 Essential (primary) hypertension: Secondary | ICD-10-CM

## 2019-02-19 LAB — POCT UA - MICROALBUMIN: Microalbumin Ur, POC: NEGATIVE mg/L

## 2019-02-19 MED ORDER — METFORMIN HCL ER 500 MG PO TB24: ORAL_TABLET | ORAL | 3 refills | Status: DC

## 2019-02-19 MED ORDER — ATORVASTATIN CALCIUM 10 MG PO TABS: 10.0000 mg | ORAL_TABLET | Freq: Every day | ORAL | 3 refills | Status: AC

## 2019-02-19 NOTE — Progress Notes (Signed)
Patient: Evelyn Wiley Female    DOB: [birth year]   63 y.o.   MRN: [id number] Visit Date: 02/19/2019  Today's Provider: Trinna Post, PA-C   Chief Complaint  Patient presents with  . Diabetes   Subjective:     HPI  Follow up for diabetes  The patient was last seen for this 3 weeks ago. Changes made at last visit include check labs.  Patient advised to come in to discuss treatment options.   Wt Readings from Last 3 Encounters:  02/19/19 231 lb 3.2 oz (104.9 kg)  02/05/19 234 lb 12.8 oz (106.5 kg)  12/05/16 252 lb 9.6 oz (114.6 kg)   She has an appointment with the rural health clinic in January near her house. She will lose her insurance in December.   Lipid Panel     Component Value Date/Time   CHOL 200 (H) 02/05/2019 1108   TRIG 374 (H) 02/05/2019 1108   HDL 36 (L) 02/05/2019 1108   CHOLHDL 5.6 (H) 02/05/2019 1108   LDLCALC 101 (H) 02/05/2019 1108   LABVLDL 63 (H) 02/05/2019 1108   Lab Results  Component Value Date   HGBA1C 7.4 (H) 02/05/2019    ------------------------------------------------------------------------------------   No Known Allergies   Current Outpatient Medications:  .  ALPRAZolam (XANAX) 1 MG tablet, Take 1 tablet (1 mg total) by mouth 3 (three) times daily as needed for anxiety., Disp: 30 tablet, Rfl: 0 .  triamterene-hydrochlorothiazide (DYAZIDE) 37.5-25 MG capsule, Take 1 each (1 capsule total) by mouth daily., Disp: 90 capsule, Rfl: 1 .  amitriptyline (ELAVIL) 10 MG tablet, Take 1 tablet (10 mg total) by mouth at bedtime. (Patient not taking: Reported on 02/19/2019), Disp: 90 tablet, Rfl: 1 .  hydrOXYzine (ATARAX/VISTARIL) 10 MG tablet, Take 1 tablet (10 mg total) by mouth 3 (three) times daily as needed. (Patient not taking: Reported on 02/19/2019), Disp: 30 tablet, Rfl: 0 .  mirabegron ER (MYRBETRIQ) 25 MG TB24 tablet, Take 1 tablet (25 mg total) by mouth daily. (Patient not taking: Reported on 02/19/2019), Disp: 90 tablet,  Rfl: 1  Review of Systems  Constitutional: Negative.   Respiratory: Negative.   Cardiovascular: Negative.   Endocrine: Negative.     Social History   Tobacco Use  . Smoking status: Former Smoker    Types: Cigarettes    Quit date: 04/21/2015    Years since quitting: 3.8  . Smokeless tobacco: Never Used  Substance Use Topics  . Alcohol use: No    Alcohol/week: 0.0 standard drinks      Objective:   BP 120/74 (BP Location: Left Arm, Patient Position: Sitting, Cuff Size: Large)   Pulse 73   Temp (!) 96.8 F (36 C) (Temporal)   Resp 16   Ht 5\' 7"  (1.702 m)   Wt 231 lb 3.2 oz (104.9 kg)   BMI 36.21 kg/m  Vitals:   02/19/19 1543  BP: 120/74  Pulse: 73  Resp: 16  Temp: (!) 96.8 F (36 C)  TempSrc: Temporal  Weight: 231 lb 3.2 oz (104.9 kg)  Height: 5\' 7"  (1.702 m)  Body mass index is 36.21 kg/m.   Physical Exam Constitutional:      Appearance: Normal appearance.  Cardiovascular:     Rate and Rhythm: Normal rate and regular rhythm.  Pulmonary:     Effort: Pulmonary effort is normal.     Breath sounds: Normal breath sounds.  Skin:    General: Skin is warm and dry.  Neurological:     Mental Status: She is alert and oriented to person, place, and time. Mental status is at baseline.  Psychiatric:        Mood and Affect: Mood normal.        Behavior: Behavior normal.      Results for orders placed or performed in visit on 02/19/19  POCT UA - Microalbumin  Result Value Ref Range   Microalbumin Ur, POC negative mg/L       Assessment & Plan    1. Hyperlipidemia associated with type 2 diabetes mellitus (HCC)  - POCT UA - Microalbumin - atorvastatin (LIPITOR) 10 MG tablet; Take 1 tablet (10 mg total) by mouth daily.  Dispense: 90 tablet; Refill: 3  2. Type 2 diabetes mellitus without complication, without long-term current use of insulin (HCC)  Will start metformin and Lipitor and see her back in one month.   3. Hypertension associated with diabetes  (HCC)   4. Need for pneumococcal vaccination  - Pneumococcal polysaccharide vaccine 23-valent greater than or equal to 2yo subcutaneous/IM  The entirety of the information documented in the History of Present Illness, Review of Systems and Physical Exam were personally obtained by me. Portions of this information were initially documented by Rondel Baton, CMA and reviewed by me for thoroughness and accuracy.       Trey Sailors, PA-C  [facility]

## 2019-02-19 NOTE — Patient Instructions (Addendum)
Take your metformin for one month. Then start lipitor, which is for your cholesterol. You got a pneumovax (pneumonia 24) shot today and your foot exam is done. You will be due for a diabetic eye exam.   You should look into your insurance options. [place] [facility] and see if they still accept people who have insurance. You should also look at the health insurance market place or Obamacare and look at health plans. If you lose insurance, please think about getting "catastrophic" health plan to protect you if you ever needed to be hospitalized.   Diabetes Mellitus and Exercise Exercising regularly is important for your overall health, especially when you have diabetes (diabetes mellitus). Exercising is not only about losing weight. It has many other health benefits, such as increasing muscle strength and bone density and reducing body fat and stress. This leads to improved fitness, flexibility, and endurance, all of which result in better overall health. Exercise has additional benefits for people with diabetes, including:  Reducing appetite.  Helping to lower and control blood glucose.  Lowering blood pressure.  Helping to control amounts of fatty substances (lipids) in the blood, such as cholesterol and triglycerides.  Helping the body to respond better to insulin (improving insulin sensitivity).  Reducing how much insulin the body needs.  Decreasing the risk for heart disease by: ? Lowering cholesterol and triglyceride levels. ? Increasing the levels of good cholesterol. ? Lowering blood glucose levels. What is my activity plan? Your health care provider or certified diabetes educator can help you make a plan for the type and frequency of exercise (activity plan) that works for you. Make sure that you:  Do at least 150 minutes of moderate-intensity or vigorous-intensity exercise each week. This could be brisk walking, biking, or water aerobics. ? Do stretching and strength  exercises, such as yoga or weightlifting, at least 2 times a week. ? Spread out your activity over at least 3 days of the week.  Get some form of physical activity every day. ? Do not go more than 2 days in a row without some kind of physical activity. ? Avoid being inactive for more than 30 minutes at a time. Take frequent breaks to walk or stretch.  Choose a type of exercise or activity that you enjoy, and set realistic goals.  Start slowly, and gradually increase the intensity of your exercise over time. What do I need to know about managing my diabetes?   Check your blood glucose before and after exercising. ? If your blood glucose is 240 mg/dL (13.3 mmol/L) or higher before you exercise, check your urine for ketones. If you have ketones in your urine, do not exercise until your blood glucose returns to normal. ? If your blood glucose is 100 mg/dL (5.6 mmol/L) or lower, eat a snack containing 15-20 grams of carbohydrate. Check your blood glucose 15 minutes after the snack to make sure that your level is above 100 mg/dL (5.6 mmol/L) before you start your exercise.  Know the symptoms of low blood glucose (hypoglycemia) and how to treat it. Your risk for hypoglycemia increases during and after exercise. Common symptoms of hypoglycemia can include: ? Hunger. ? Anxiety. ? Sweating and feeling clammy. ? Confusion. ? Dizziness or feeling light-headed. ? Increased heart rate or palpitations. ? Blurry vision. ? Tingling or numbness around the mouth, lips, or tongue. ? Tremors or shakes. ? Irritability.  Keep a rapid-acting carbohydrate snack available before, during, and after exercise to help prevent or  treat hypoglycemia.  Avoid injecting insulin into areas of the body that are going to be exercised. For example, avoid injecting insulin into: ? The arms, when playing tennis. ? The legs, when jogging.  Keep records of your exercise habits. Doing this can help you and your health care  provider adjust your diabetes management plan as needed. Write down: ? Food that you eat before and after you exercise. ? Blood glucose levels before and after you exercise. ? The type and amount of exercise you have done. ? When your insulin is expected to peak, if you use insulin. Avoid exercising at times when your insulin is peaking.  When you start a new exercise or activity, work with your health care provider to make sure the activity is safe for you, and to adjust your insulin, medicines, or food intake as needed.  Drink plenty of water while you exercise to prevent dehydration or heat stroke. Drink enough fluid to keep your urine clear or pale yellow. Summary  Exercising regularly is important for your overall health, especially when you have diabetes (diabetes mellitus).  Exercising has many health benefits, such as increasing muscle strength and bone density and reducing body fat and stress.  Your health care provider or certified diabetes educator can help you make a plan for the type and frequency of exercise (activity plan) that works for you.  When you start a new exercise or activity, work with your health care provider to make sure the activity is safe for you, and to adjust your insulin, medicines, or food intake as needed. This information is not intended to replace advice given to you by your health care provider. Make sure you discuss any questions you have with your health care provider. Document Released: 07/20/2003 Document Revised: 11/21/2016 Document Reviewed: 10/09/2015 Elsevier Patient Education  2020 [employer].

## 2019-02-24 MED ORDER — METFORMIN HCL 500 MG PO TABS: ORAL_TABLET | ORAL | 0 refills | Status: DC

## 2019-03-09 LAB — COLOGUARD: Cologuard: NEGATIVE

## 2019-03-26 ENCOUNTER — Ambulatory Visit (INDEPENDENT_AMBULATORY_CARE_PROVIDER_SITE_OTHER): Payer: 59 | Admitting: Physician Assistant

## 2019-03-26 ENCOUNTER — Encounter: Payer: Self-pay | Admitting: Physician Assistant

## 2019-03-26 ENCOUNTER — Other Ambulatory Visit: Payer: Self-pay

## 2019-03-26 ENCOUNTER — Telehealth: Payer: Self-pay | Admitting: Physician Assistant

## 2019-03-26 VITALS — BP 121/81 | HR 76 | Temp 96.9°F | Wt 225.4 lb

## 2019-03-26 DIAGNOSIS — E785 Hyperlipidemia, unspecified: Secondary | ICD-10-CM | POA: Diagnosis not present

## 2019-03-26 DIAGNOSIS — E119 Type 2 diabetes mellitus without complications: Secondary | ICD-10-CM

## 2019-03-26 DIAGNOSIS — E1169 Type 2 diabetes mellitus with other specified complication: Secondary | ICD-10-CM | POA: Diagnosis not present

## 2019-03-26 NOTE — Patient Instructions (Addendum)
American Diabetes Association website Take two tablets of metformin in the morning and none at night.   Diabetes Mellitus and Exercise Exercising regularly is important for your overall health, especially when you have diabetes (diabetes mellitus). Exercising is not only about losing weight. It has many other health benefits, such as increasing muscle strength and bone density and reducing body fat and stress. This leads to improved fitness, flexibility, and endurance, all of which result in better overall health. Exercise has additional benefits for people with diabetes, including:  Reducing appetite.  Helping to lower and control blood glucose.  Lowering blood pressure.  Helping to control amounts of fatty substances (lipids) in the blood, such as cholesterol and triglycerides.  Helping the body to respond better to insulin (improving insulin sensitivity).  Reducing how much insulin the body needs.  Decreasing the risk for heart disease by: ? Lowering cholesterol and triglyceride levels. ? Increasing the levels of good cholesterol. ? Lowering blood glucose levels. What is my activity plan? Your health care provider or certified diabetes educator can help you make a plan for the type and frequency of exercise (activity plan) that works for you. Make sure that you:  Do at least 150 minutes of moderate-intensity or vigorous-intensity exercise each week. This could be brisk walking, biking, or water aerobics. ? Do stretching and strength exercises, such as yoga or weightlifting, at least 2 times a week. ? Spread out your activity over at least 3 days of the week.  Get some form of physical activity every day. ? Do not go more than 2 days in a row without some kind of physical activity. ? Avoid being inactive for more than 30 minutes at a time. Take frequent breaks to walk or stretch.  Choose a type of exercise or activity that you enjoy, and set realistic goals.  Start slowly, and  gradually increase the intensity of your exercise over time. What do I need to know about managing my diabetes?   Check your blood glucose before and after exercising. ? If your blood glucose is 240 mg/dL (40.9 mmol/L) or higher before you exercise, check your urine for ketones. If you have ketones in your urine, do not exercise until your blood glucose returns to normal. ? If your blood glucose is 100 mg/dL (5.6 mmol/L) or lower, eat a snack containing 15-20 grams of carbohydrate. Check your blood glucose 15 minutes after the snack to make sure that your level is above 100 mg/dL (5.6 mmol/L) before you start your exercise.  Know the symptoms of low blood glucose (hypoglycemia) and how to treat it. Your risk for hypoglycemia increases during and after exercise. Common symptoms of hypoglycemia can include: ? Hunger. ? Anxiety. ? Sweating and feeling clammy. ? Confusion. ? Dizziness or feeling light-headed. ? Increased heart rate or palpitations. ? Blurry vision. ? Tingling or numbness around the mouth, lips, or tongue. ? Tremors or shakes. ? Irritability.  Keep a rapid-acting carbohydrate snack available before, during, and after exercise to help prevent or treat hypoglycemia.  Avoid injecting insulin into areas of the body that are going to be exercised. For example, avoid injecting insulin into: ? The arms, when playing tennis. ? The legs, when jogging.  Keep records of your exercise habits. Doing this can help you and your health care provider adjust your diabetes management plan as needed. Write down: ? Food that you eat before and after you exercise. ? Blood glucose levels before and after you exercise. ? The type  and amount of exercise you have done. ? When your insulin is expected to peak, if you use insulin. Avoid exercising at times when your insulin is peaking.  When you start a new exercise or activity, work with your health care provider to make sure the activity is safe  for you, and to adjust your insulin, medicines, or food intake as needed.  Drink plenty of water while you exercise to prevent dehydration or heat stroke. Drink enough fluid to keep your urine clear or pale yellow. Summary  Exercising regularly is important for your overall health, especially when you have diabetes (diabetes mellitus).  Exercising has many health benefits, such as increasing muscle strength and bone density and reducing body fat and stress.  Your health care provider or certified diabetes educator can help you make a plan for the type and frequency of exercise (activity plan) that works for you.  When you start a new exercise or activity, work with your health care provider to make sure the activity is safe for you, and to adjust your insulin, medicines, or food intake as needed. This information is not intended to replace advice given to you by your health care provider. Make sure you discuss any questions you have with your health care provider. Document Released: 07/20/2003 Document Revised: 11/21/2016 Document Reviewed: 10/09/2015 Elsevier Patient Education  2020 [employer].

## 2019-03-26 NOTE — Telephone Encounter (Signed)
Pt forgot to get the diabetic website Adriana had recommended in the visit.  Please call pt back at [phone].  Thanks, [employer]

## 2019-03-26 NOTE — Progress Notes (Signed)
Patient: Evelyn Wiley Female    DOB: August 08, 1955   63 y.o.   MRN: [id number] Visit Date: 03/26/2019  Today's Provider: Trey Sailors, PA-C   Chief Complaint  Patient presents with  . Diabetes   Subjective:     HPI  Diabetes Mellitus Type II, Follow-up:   Lab Results  Component Value Date   HGBA1C 7.4 (H) 02/05/2019    Last seen for diabetes 1 months ago.  Management since then includes starting Metformin 500 MG BID. She reports poor compliance with treatment. She is having side effects. Patient reports that the medication keeps her up at night. Current symptoms include none and have been stable. Home blood sugar records: around 130's-160's  Episodes of hypoglycemia? no   Current insulin regiment: Is not on insulin Most Recent Eye Exam: not scheduled, she is establishing with a new free clinic near her hometown on 05/15/2018 Weight trend: stable Prior visit with dietician: No Current exercise: aerobics and walking Current diet habits: well balanced  Pertinent Labs:    Component Value Date/Time   CHOL 200 (H) 02/05/2019 1108   TRIG 374 (H) 02/05/2019 1108   HDL 36 (L) 02/05/2019 1108   LDLCALC 101 (H) 02/05/2019 1108   CREATININE 0.74 02/05/2019 1108   CREATININE 0.73 06/20/2011 1840   She has lost weight since last visit.   Wt Readings from Last 3 Encounters:  03/26/19 225 lb 6.4 oz (102.2 kg)  02/19/19 231 lb 3.2 oz (104.9 kg)  02/05/19 234 lb 12.8 oz (106.5 kg)    ------------------------------------------------------------------------    No Known Allergies   Current Outpatient Medications:  .  ALPRAZolam (XANAX) 1 MG tablet, Take 1 tablet (1 mg total) by mouth 3 (three) times daily as needed for anxiety., Disp: 30 tablet, Rfl: 0 .  atorvastatin (LIPITOR) 10 MG tablet, Take 1 tablet (10 mg total) by mouth daily., Disp: 90 tablet, Rfl: 3 .  metFORMIN (GLUCOPHAGE) 500 MG tablet, Take one pill in the morning for one week. Then on week  two, take one pill twice daily onward., Disp: 180 tablet, Rfl: 0 .  triamterene-hydrochlorothiazide (DYAZIDE) 37.5-25 MG capsule, Take 1 each (1 capsule total) by mouth daily., Disp: 90 capsule, Rfl: 1  Review of Systems  Social History   Tobacco Use  . Smoking status: Former Smoker    Types: Cigarettes    Quit date: 04/21/2015    Years since quitting: 3.9  . Smokeless tobacco: Never Used  Substance Use Topics  . Alcohol use: No    Alcohol/week: 0.0 standard drinks      Objective:   BP 121/81 (BP Location: Left Arm, Patient Position: Sitting, Cuff Size: Normal)   Pulse 76   Temp (!) 96.9 F (36.1 C) (Temporal)   Wt 225 lb 6.4 oz (102.2 kg)   BMI 35.30 kg/m  Vitals:   03/26/19 1446  BP: 121/81  Pulse: 76  Temp: (!) 96.9 F (36.1 C)  TempSrc: Temporal  Weight: 225 lb 6.4 oz (102.2 kg)  Body mass index is 35.3 kg/m.   Physical Exam Constitutional:      Appearance: Normal appearance.  Cardiovascular:     Rate and Rhythm: Normal rate and regular rhythm.     Heart sounds: Normal heart sounds.  Pulmonary:     Effort: Pulmonary effort is normal.     Breath sounds: Normal breath sounds.  Skin:    General: Skin is warm and dry.  Neurological:  Mental Status: She is alert and oriented to person, place, and time. Mental status is at baseline.  Psychiatric:        Mood and Affect: Mood normal.        Behavior: Behavior normal.      No results found for any visits on 03/26/19.     Assessment & Plan    1. Diabetes mellitus without complication ([place])  She has lost 9 lbs since 02/05/2019. Congratulated her on her efforts. She can try taking 1000 mg metformin in the morning to help with insomnia. She is establishing with new clinic. If the metformin is not working for her, she may call back and we can send in a different medication.   - HgB A1c  2. Hyperlipidemia associated with type 2 diabetes mellitus ([place])  She did not pick up lipitor because this was  unaffordable to her. I do think she needs this and hopefully her new clinic will be able to help her afford this.   The entirety of the information documented in the History of Present Illness, Review of Systems and Physical Exam were personally obtained by me. Portions of this information were initially documented by [facility], CMA and reviewed by me for thoroughness and accuracy.   F/u PRN        Trinna Post, PA-C  [facility]

## 2019-03-27 LAB — LIPID PANEL
Chol/HDL Ratio: 4.8 ratio — ABNORMAL HIGH (ref 0.0–4.4)
Cholesterol, Total: 168 mg/dL (ref 100–199)
HDL: 35 mg/dL — ABNORMAL LOW (ref >39)
LDL Chol Calc (NIH): 81 mg/dL (ref 0–99)
Triglycerides: 316 mg/dL — ABNORMAL HIGH (ref 0–149)
VLDL Cholesterol Cal: 52 mg/dL — ABNORMAL HIGH (ref 5–40)

## 2019-03-27 LAB — HEMOGLOBIN A1C
Est. average glucose Bld gHb Est-mCnc: 134 mg/dL
Hgb A1c MFr Bld: 6.3 % — ABNORMAL HIGH (ref 4.8–5.6)

## 2019-03-29 NOTE — Telephone Encounter (Signed)
American diabetes association

## 2019-03-29 NOTE — Telephone Encounter (Signed)
Patient was advised.  

## 2019-04-15 ENCOUNTER — Telehealth: Payer: Self-pay | Admitting: Physician Assistant

## 2019-04-15 DIAGNOSIS — E119 Type 2 diabetes mellitus without complications: Secondary | ICD-10-CM

## 2019-04-15 NOTE — Telephone Encounter (Signed)
rx refill metFORMIN (GLUCOPHAGE) 500 MG tablet  PHARMACY [facility] [address] [place], [place] [address]   Patient is confused about medication refill.Patient is requesting a call before medication refill. Call back [phone]

## 2019-04-16 MED ORDER — METFORMIN HCL 500 MG PO TABS: ORAL_TABLET | ORAL | 0 refills | Status: DC

## 2019-04-28 ENCOUNTER — Other Ambulatory Visit: Payer: Self-pay | Admitting: Physician Assistant

## 2019-04-28 DIAGNOSIS — E119 Type 2 diabetes mellitus without complications: Secondary | ICD-10-CM

## 2019-04-28 NOTE — Telephone Encounter (Signed)
From [facility] 

## 2019-04-28 NOTE — Telephone Encounter (Signed)
Pt called in to request a 30 day supply metFORMIN (GLUCOPHAGE) 500 MG tablet , pt says that she will receive a discount if she do a 90 day supply.   Please assist.

## 2019-04-29 MED ORDER — METFORMIN HCL 500 MG PO TABS: ORAL_TABLET | ORAL | 0 refills | Status: AC

## 2019-04-29 NOTE — Telephone Encounter (Signed)
I called and spoke with patient. She would like a 90 day supply prescription for metformin to be sent to her pharmacy. She says she gets a discount with a 90 day supply. Patient has not picked up the prescription that was sent in on 04/16/2019. She still had some medication left from the last prescription. Per Patient, she takes on pill twice daily. Please review directions and advise if ok for resend prescription for a 90 day supply.
